# Patient Record
Sex: Male | Born: 1935 | Race: White | Hispanic: No | Marital: Married | Smoking: Former smoker
Health system: Southern US, Community
[De-identification: ages and names within clinical notes are randomized; demographics above are authoritative.]

## PROBLEM LIST (undated history)

## (undated) DIAGNOSIS — F329 Major depressive disorder, single episode, unspecified: Secondary | ICD-10-CM

## (undated) DIAGNOSIS — M4800 Spinal stenosis, site unspecified: Secondary | ICD-10-CM

## (undated) DIAGNOSIS — F419 Anxiety disorder, unspecified: Secondary | ICD-10-CM

## (undated) DIAGNOSIS — F32A Depression, unspecified: Secondary | ICD-10-CM

## (undated) DIAGNOSIS — R1310 Dysphagia, unspecified: Secondary | ICD-10-CM

## (undated) DIAGNOSIS — K224 Dyskinesia of esophagus: Secondary | ICD-10-CM

## (undated) DIAGNOSIS — J449 Chronic obstructive pulmonary disease, unspecified: Secondary | ICD-10-CM

---

## 2005-01-04 ENCOUNTER — Ambulatory Visit: Payer: Self-pay

## 2005-01-17 ENCOUNTER — Inpatient Hospital Stay: Payer: Self-pay

## 2005-03-28 ENCOUNTER — Ambulatory Visit: Payer: Self-pay

## 2005-04-15 ENCOUNTER — Ambulatory Visit: Payer: Self-pay

## 2005-04-26 ENCOUNTER — Ambulatory Visit: Payer: Self-pay

## 2005-05-26 ENCOUNTER — Ambulatory Visit: Payer: Self-pay | Admitting: Oncology

## 2005-06-09 ENCOUNTER — Ambulatory Visit: Payer: Self-pay | Admitting: Oncology

## 2005-07-08 ENCOUNTER — Ambulatory Visit: Payer: Self-pay | Admitting: Oncology

## 2005-10-07 ENCOUNTER — Ambulatory Visit: Payer: Self-pay | Admitting: Oncology

## 2005-10-13 ENCOUNTER — Ambulatory Visit: Payer: Self-pay | Admitting: Oncology

## 2006-07-13 ENCOUNTER — Ambulatory Visit: Payer: Self-pay | Admitting: Oncology

## 2006-07-17 ENCOUNTER — Ambulatory Visit: Payer: Self-pay | Admitting: Oncology

## 2006-08-12 IMAGING — CT CT CHEST W/ CM
1 series · 15 of 31 positions shown, 19 images · non-contrast
Comparison: none

REASON FOR EXAM: follow up infiltrates seen on ct of 01-04-05- with called
report
COMMENTS:

[Series 2: soft tissue · axial · 0.71mm/px · z∈[-381,-41]mm · 15 of 74 slices shown, 19 images]
[im 3/74  mediastinal]
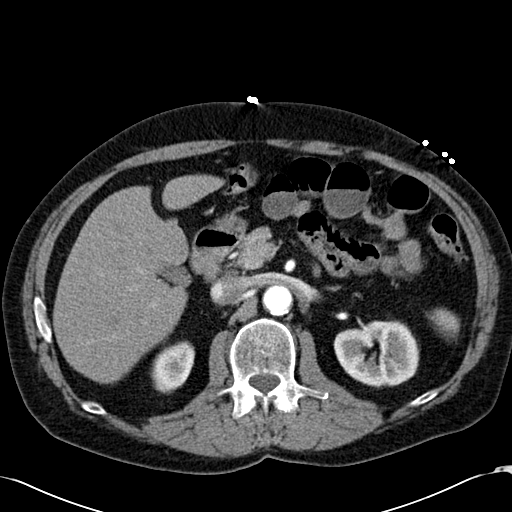
[im 3/74  lung]
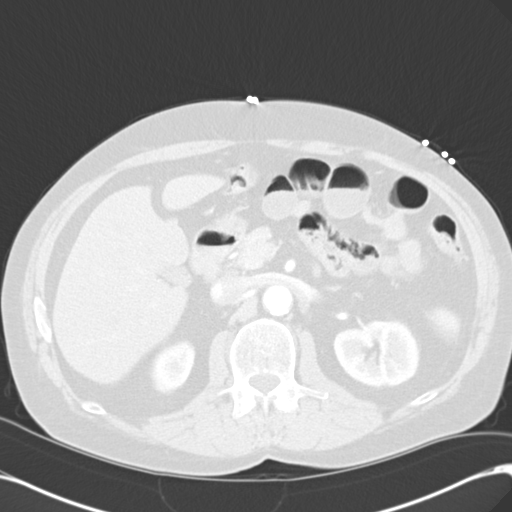
[im 9/74  lung]
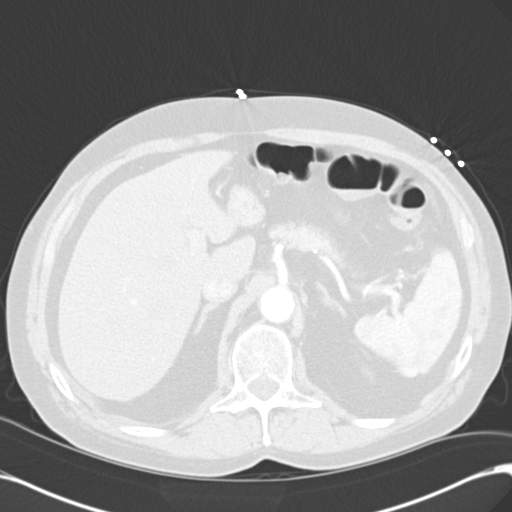
[im 14/74  lung]
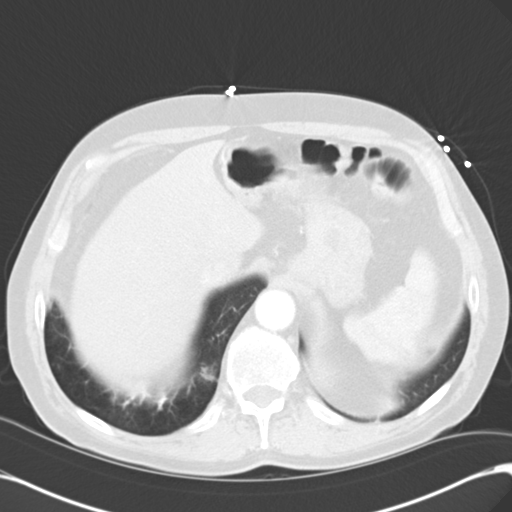
[im 17/74  lung]
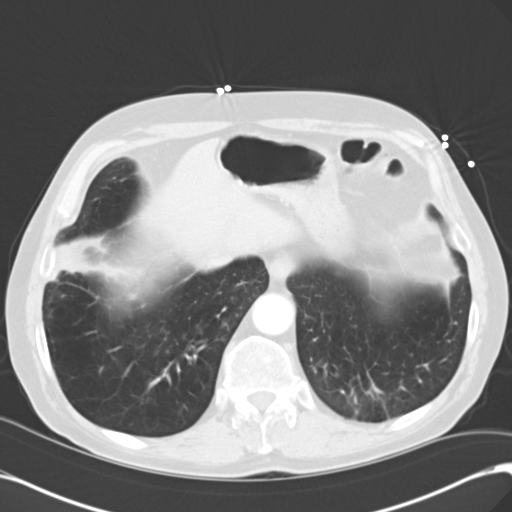
[im 22/74  mediastinal]
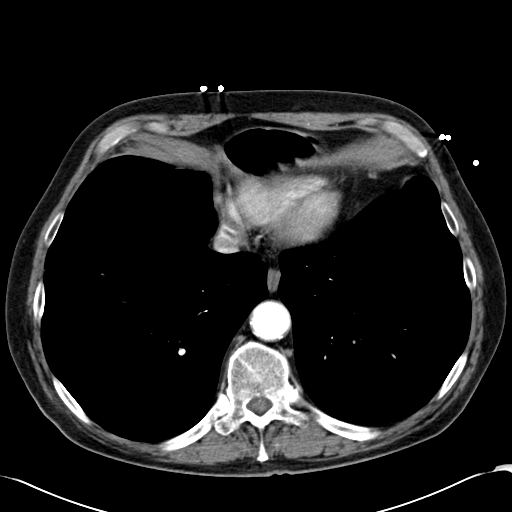
[im 22/74  lung]
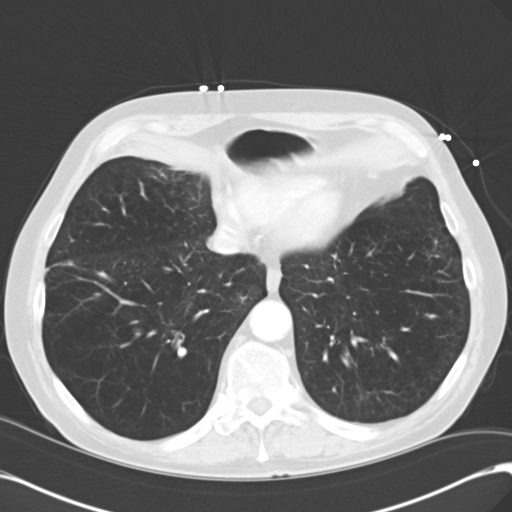
[im 28/74  lung]
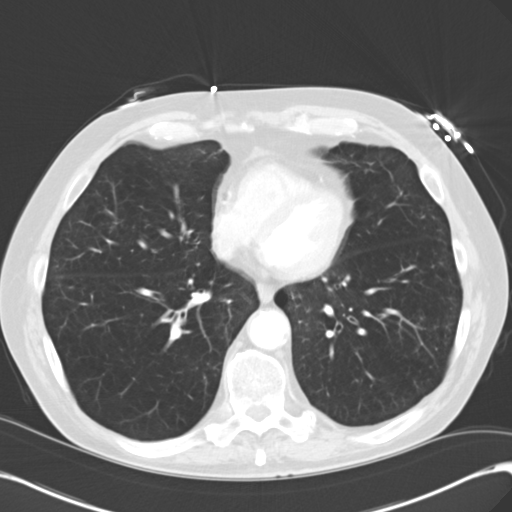
[im 33/74  lung]
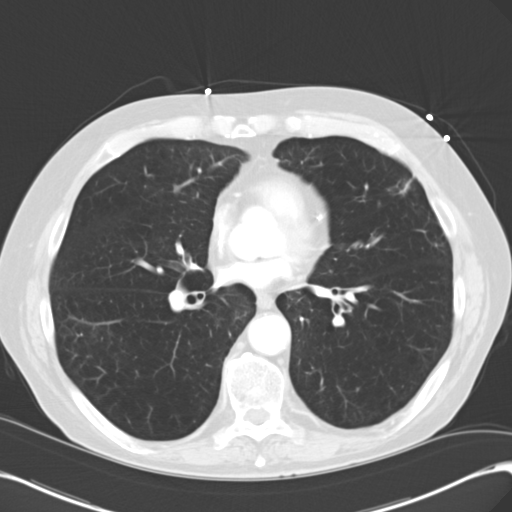
[im 38/74  lung]
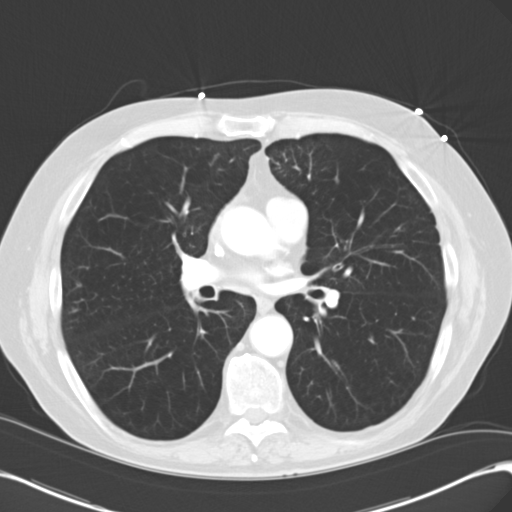
[im 41/74  mediastinal]
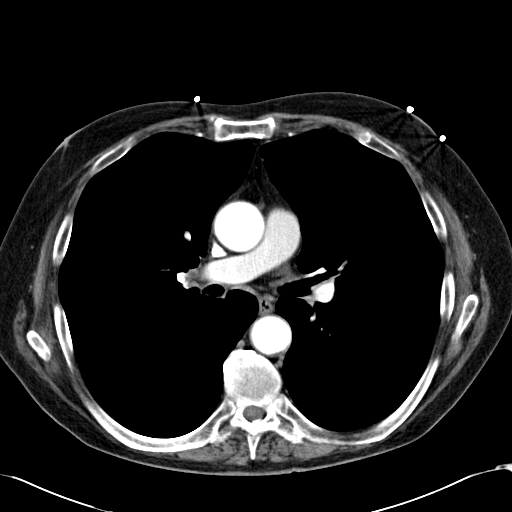
[im 41/74  lung]
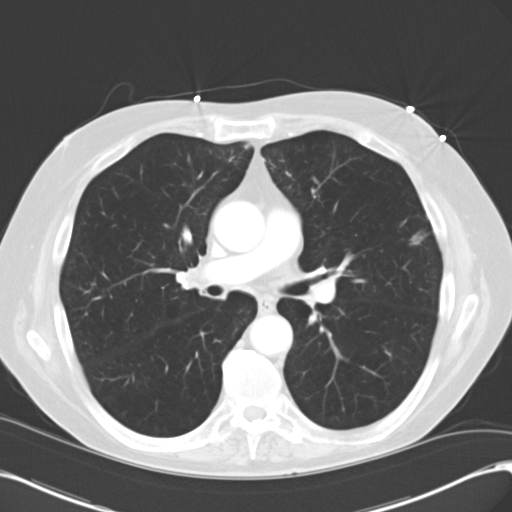
[im 46/74  lung]
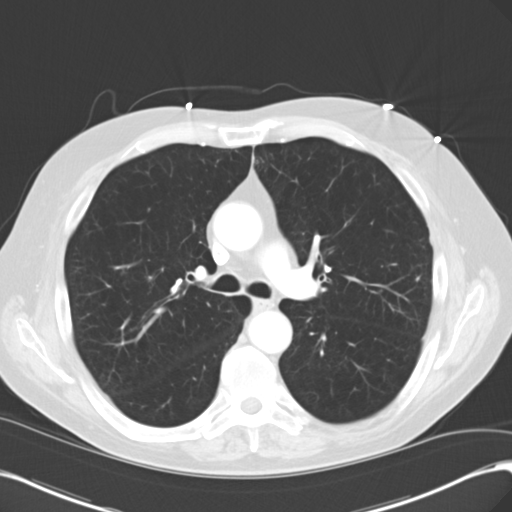
[im 52/74  lung]
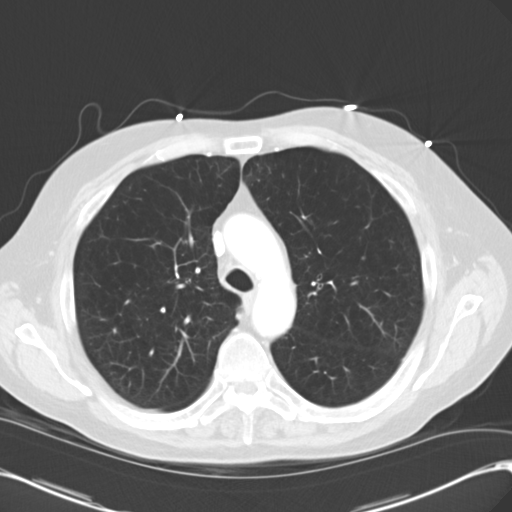
[im 57/74  lung]
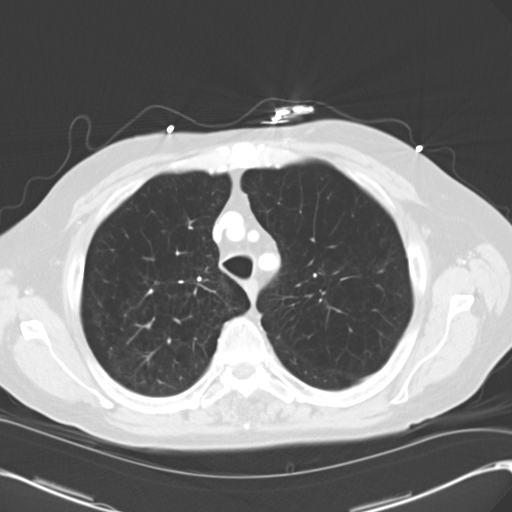
[im 60/74  mediastinal]
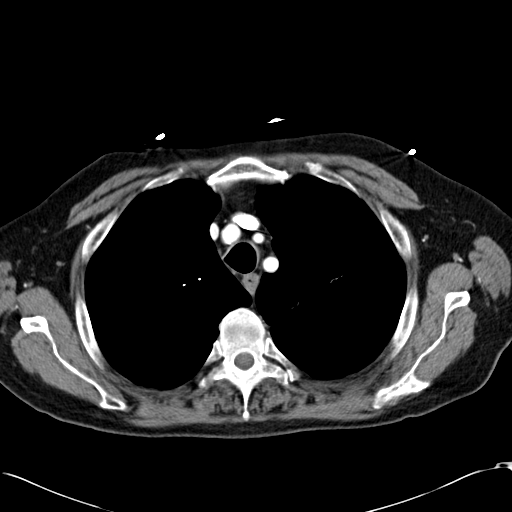
[im 60/74  lung]
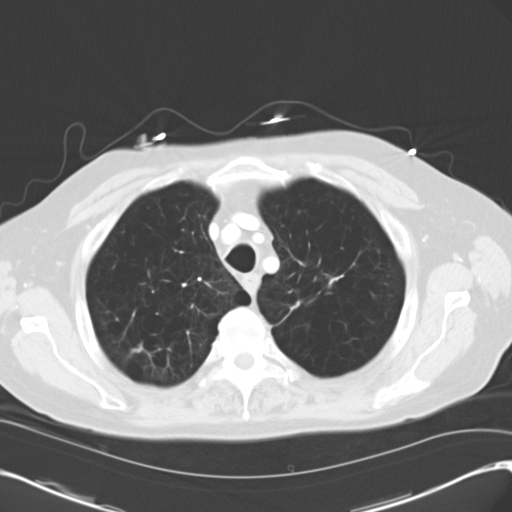
[im 65/74  lung]
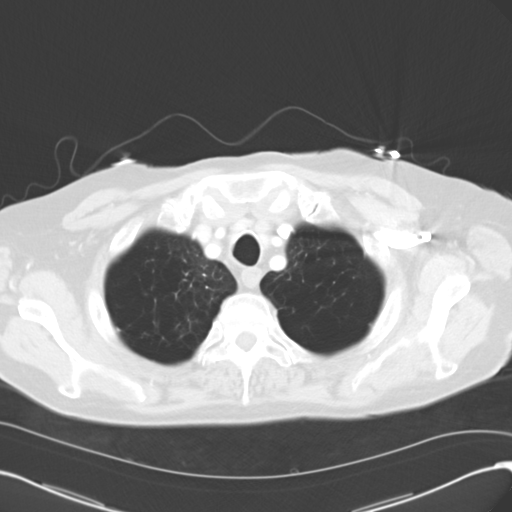
[im 71/74  lung]
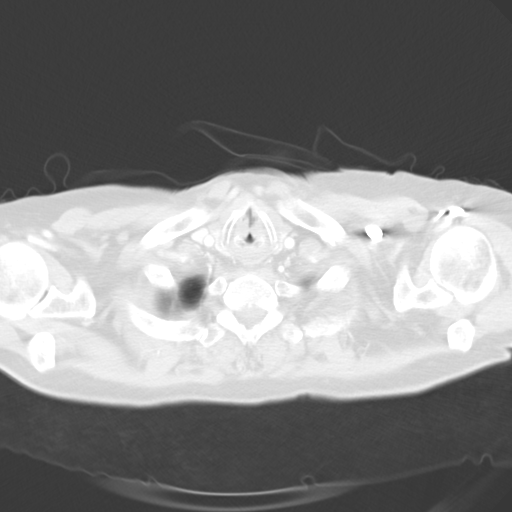

[15 of 31 positions shown; findings below may reference images not displayed]

PROCEDURE:     CT  - CT CHEST WITH CONTRAST  - January 17, 2005  [DATE]

RESULT:     Comparison is made to prior study dated 01-04-05.

Spiral 5 mm sections were obtained from the thoracic inlet through the lung
bases status post intravenous administration of 75 ccs of Isovue 370.

Evaluation of the mediastinum and hilar regions and structures demonstrates
no evidence of mediastinal or hilar adenopathy nor masses.

Evaluation of the lung parenchyma once again demonstrates a wedge shaped
area of increased density within the lateral aspect of the RIGHT lung base
along the anterior portion of the lateral basal segment of the RIGHT lower
lobe.  When compared to the previous study dated 01-04-05 this area is
unchanged.  No new pulmonary nodules or masses are identified. There are
diffuse emphysematous changes within the lungs with greatest confluence in
the lung bases.

The visualized upper abdominal viscera demonstrate no radiographic
abnormalities.
IMPRESSION: 1)Stable area of increased wedge shaped density within the RIGHT lung base
differential considerations are scar versus atelectasis.  A chronic
infiltrate cannot be excluded. There is a soft tissue component appreciated
with this area and a small mass or nodule within this area cannot be totally
excluded.  Surveillance evaluation of this region as clinically indicated is
recommended.  No further masses or nodules are identified.

2)Emphysematous changes throughout both lungs as described above.

## 2006-10-21 IMAGING — CT CT CHEST W/ CM
1 series · 15 of 31 positions shown, 19 images · non-contrast
Comparison: none

REASON FOR EXAM: Follow up 2 mo infiltrate on Chest CT
COMMENTS:

[Series 2: soft tissue · axial · 0.72mm/px · z∈[-515,-150]mm · 15 of 79 slices shown, 19 images]
[im 3/79  mediastinal]
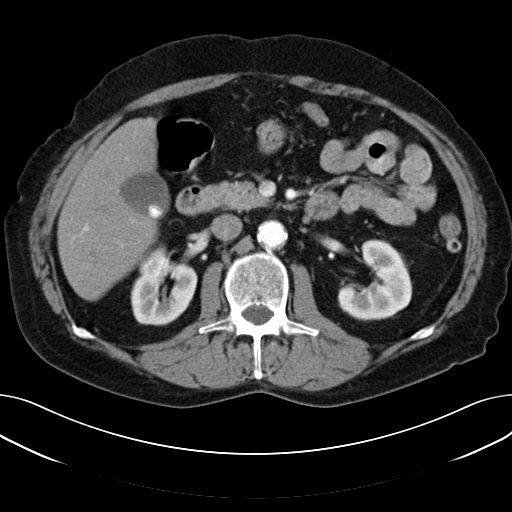
[im 3/79  lung]
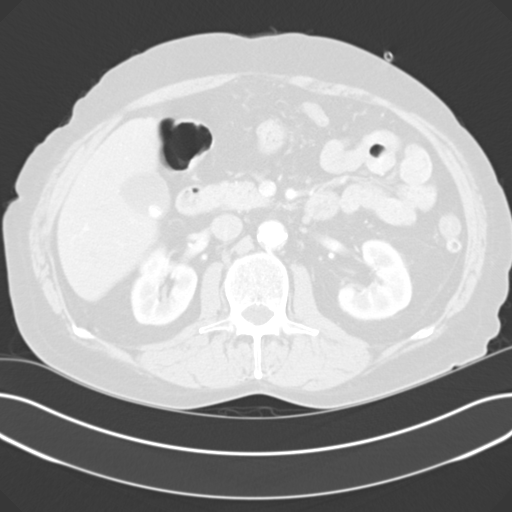
[im 9/79  lung]
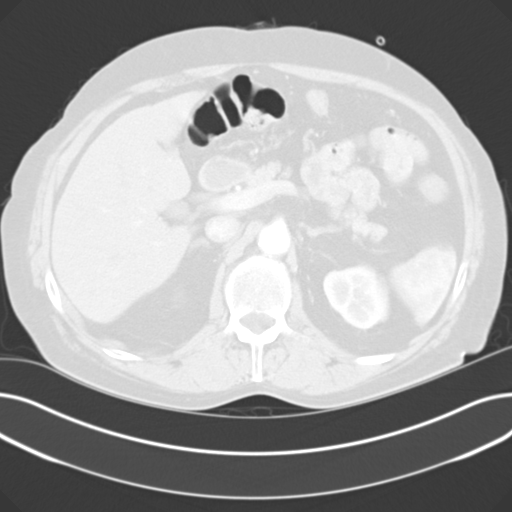
[im 15/79  lung]
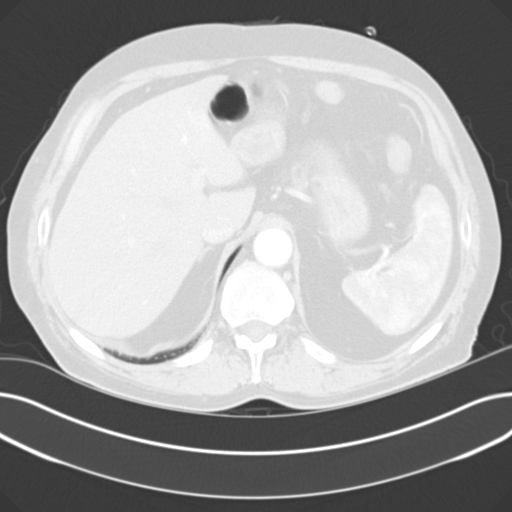
[im 18/79  lung]
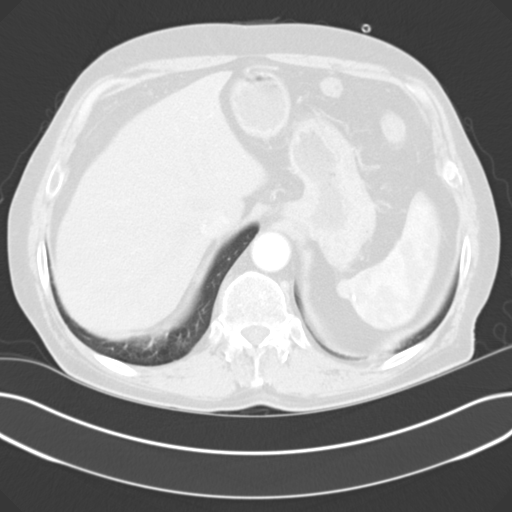
[im 24/79  mediastinal]
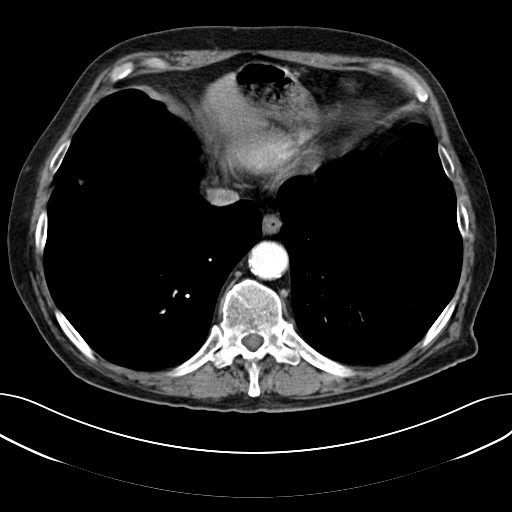
[im 24/79  lung]
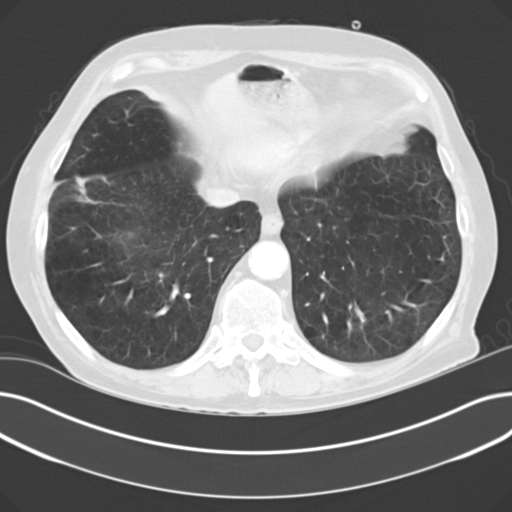
[im 29/79  lung]
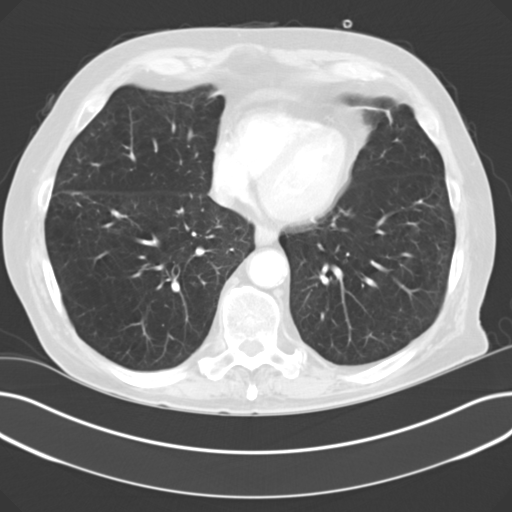
[im 35/79  lung]
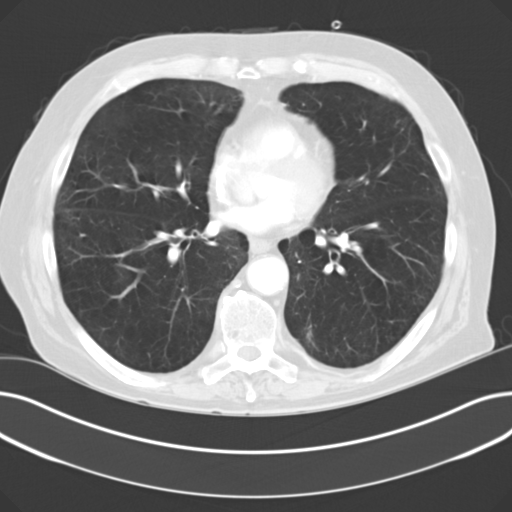
[im 41/79  lung]
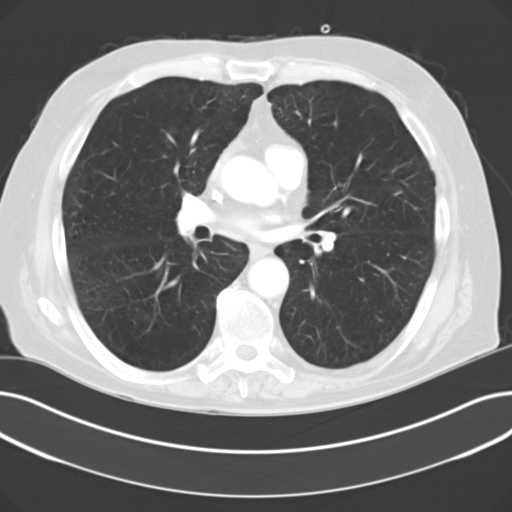
[im 44/79  mediastinal]
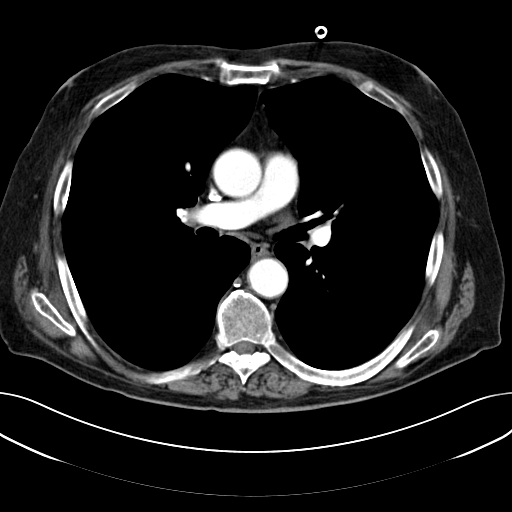
[im 44/79  lung]
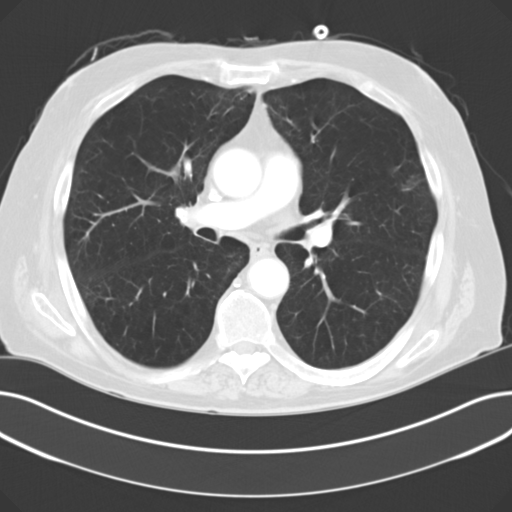
[im 50/79  lung]
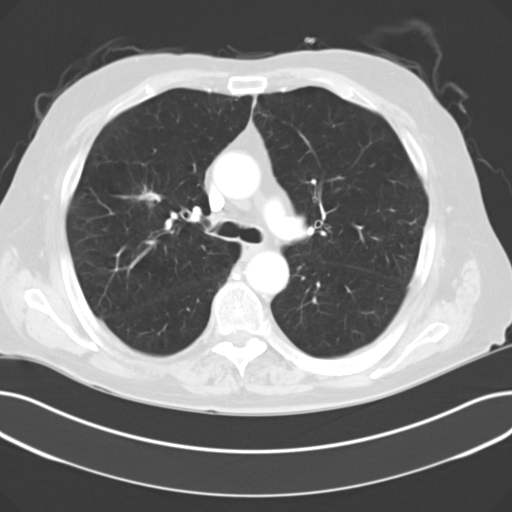
[im 55/79  lung]
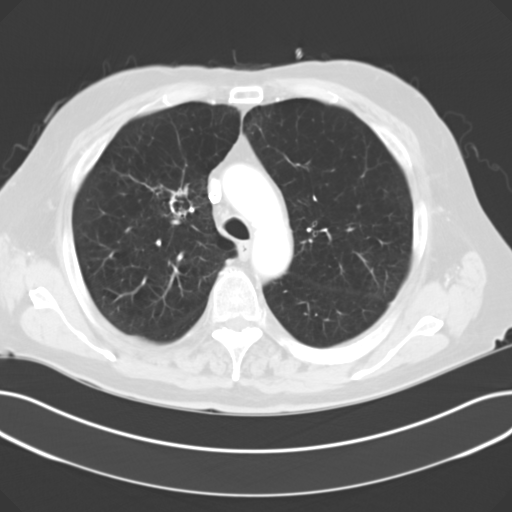
[im 61/79  lung]
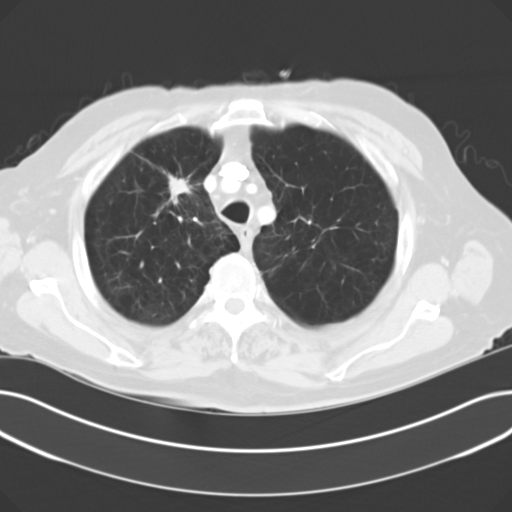
[im 64/79  mediastinal]
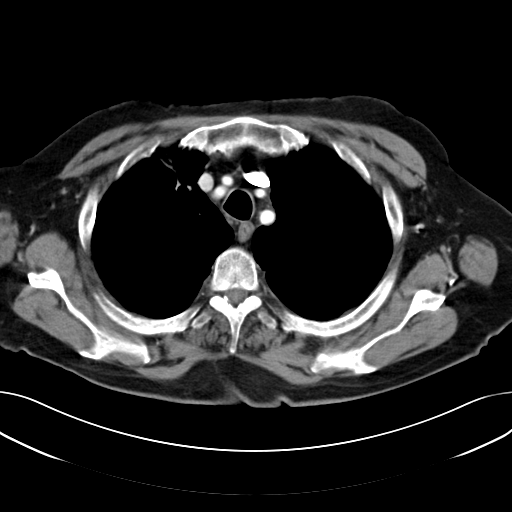
[im 64/79  lung]
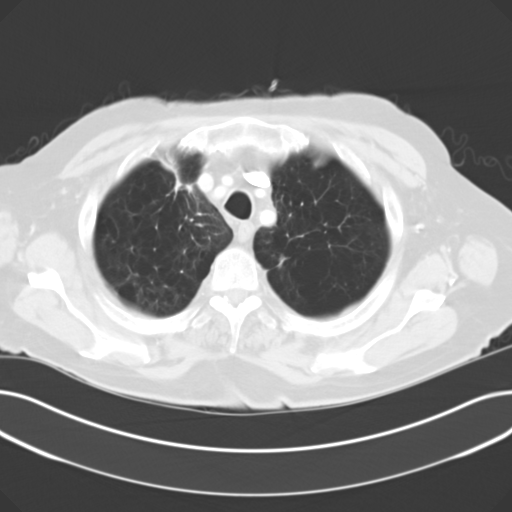
[im 70/79  lung]
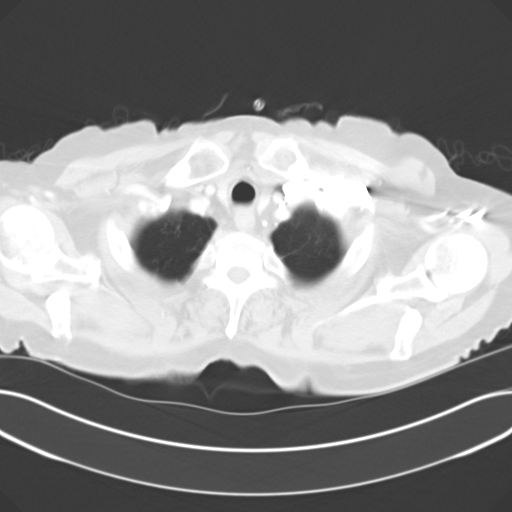
[im 76/79  lung]
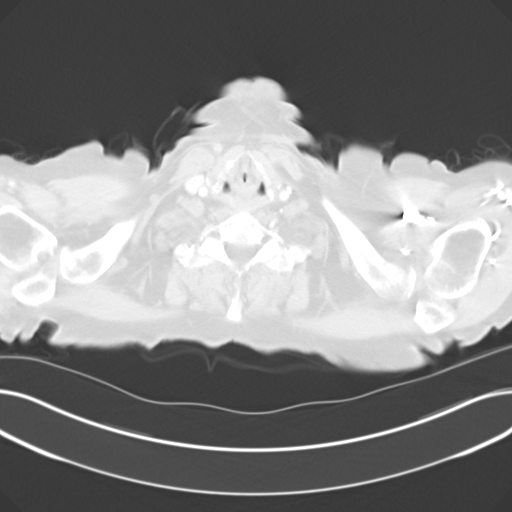

[15 of 31 positions shown; findings below may reference images not displayed]

PROCEDURE:     CT  - CT CHEST WITH CONTRAST  - March 28, 2005  [DATE]

RESULT:     Comparison is made to the prior study performed on 01-17-05.
There is no evidence of a mediastinal or hilar mass or adenopathy. The heart
is not enlarged. Gallstones are noted in the dependent portion of the
gallbladder.  This area was not included on the prior study.  Diffuse
emphysematous changes are present. There is an area of nodular density
involving the RIGHT upper lobe with irregular margins.  This is at the level
of the origin of the great vessels.  A second area of density is seen in the
RIGHT upper lobe at the level of the carina.  The lung windows show
persistence of areas of ill-defined increased density at the lung bases
especially laterally in the RIGHT costophrenic angle region suggestive of
fibrosis.  The areas in the RIGHT upper lobe were not present on the prior
exam. The larger of these measures up to approximately 1.5 cm in size.
There may be some minimal density posteriorly at the same level on image #17
and follow-up on subsequent studies would be recommended. The upper
abdominal viscera included on the study otherwise appear to be normal aside
from the previously mentioned gallstones.
IMPRESSION: 1)Areas of soft tissue density and irregular margins in the RIGHT upper lobe
as described.  These were not present on the prior study.  Differential
considerations would include an acute infectious etiology given the rather
dramatic change over the short interval compared to the prior study as well
as neoplastic disease.  There is no evidence of reactive mediastinal or
hilar adenopathy or metastatic involvement within the mediastinal or hilar
structures.  Correlation for signs or symptoms of an acute infectious
process would be recommended.

2)Underlying COPD is noted.  Follow-up with PET scan may be necessary for
further evaluation.

## 2007-07-09 ENCOUNTER — Ambulatory Visit: Payer: Self-pay | Admitting: Oncology

## 2007-07-23 ENCOUNTER — Ambulatory Visit: Payer: Self-pay | Admitting: Oncology

## 2007-08-08 ENCOUNTER — Ambulatory Visit: Payer: Self-pay | Admitting: Oncology

## 2007-11-01 ENCOUNTER — Other Ambulatory Visit: Payer: Self-pay

## 2007-11-01 ENCOUNTER — Inpatient Hospital Stay: Payer: Self-pay | Admitting: Specialist

## 2010-10-19 ENCOUNTER — Ambulatory Visit: Payer: Self-pay | Admitting: Orthopedic Surgery

## 2010-11-30 ENCOUNTER — Ambulatory Visit: Payer: Self-pay | Admitting: Pain Medicine

## 2010-12-13 ENCOUNTER — Ambulatory Visit: Payer: Self-pay | Admitting: Pain Medicine

## 2010-12-21 ENCOUNTER — Ambulatory Visit: Payer: Self-pay | Admitting: Pain Medicine

## 2011-01-03 ENCOUNTER — Ambulatory Visit: Payer: Self-pay | Admitting: Pain Medicine

## 2014-04-20 ENCOUNTER — Emergency Department: Payer: Self-pay | Admitting: Internal Medicine

## 2014-04-20 LAB — COMPREHENSIVE METABOLIC PANEL
ALT: 15 U/L (ref 12–78)
AST: 13 U/L — AB (ref 15–37)
Albumin: 3.6 g/dL (ref 3.4–5.0)
Alkaline Phosphatase: 71 U/L
Anion Gap: 3 — ABNORMAL LOW (ref 7–16)
BUN: 16 mg/dL (ref 7–18)
Bilirubin,Total: 0.5 mg/dL (ref 0.2–1.0)
Calcium, Total: 9.3 mg/dL (ref 8.5–10.1)
Chloride: 101 mmol/L (ref 98–107)
Co2: 32 mmol/L (ref 21–32)
Creatinine: 0.78 mg/dL (ref 0.60–1.30)
EGFR (Non-African Amer.): >60
Glucose: 119 mg/dL — ABNORMAL HIGH (ref 65–99)
OSMOLALITY: 274 (ref 275–301)
POTASSIUM: 4.1 mmol/L (ref 3.5–5.1)
Sodium: 136 mmol/L (ref 136–145)
Total Protein: 6.7 g/dL (ref 6.4–8.2)

## 2014-04-20 LAB — CBC
HCT: 44.8 % (ref 40.0–52.0)
HGB: 14.9 g/dL (ref 13.0–18.0)
MCH: 28.4 pg (ref 26.0–34.0)
MCHC: 33.2 g/dL (ref 32.0–36.0)
MCV: 86 fL (ref 80–100)
Platelet: 279 10*3/uL (ref 150–440)
RBC: 5.24 10*6/uL (ref 4.40–5.90)
RDW: 13.4 % (ref 11.5–14.5)
WBC: 8 10*3/uL (ref 3.8–10.6)

## 2014-04-20 LAB — PRO B NATRIURETIC PEPTIDE: B-Type Natriuretic Peptide: 162 pg/mL (ref 0–450)

## 2014-04-20 LAB — TROPONIN I

## 2015-07-17 ENCOUNTER — Other Ambulatory Visit: Payer: Self-pay | Admitting: Specialist

## 2015-07-17 DIAGNOSIS — J984 Other disorders of lung: Secondary | ICD-10-CM

## 2015-07-23 ENCOUNTER — Ambulatory Visit
Admission: RE | Admit: 2015-07-23 | Discharge: 2015-07-23 | Disposition: A | Discharge status: Home / Self Care | Payer: Medicare Other | Source: Ambulatory Visit | Attending: Specialist | Admitting: Specialist

## 2015-07-23 DIAGNOSIS — J439 Emphysema, unspecified: Secondary | ICD-10-CM | POA: Diagnosis not present

## 2015-07-23 DIAGNOSIS — J984 Other disorders of lung: Secondary | ICD-10-CM | POA: Diagnosis present

## 2016-02-11 ENCOUNTER — Other Ambulatory Visit: Payer: Self-pay | Admitting: Physician Assistant

## 2016-02-11 DIAGNOSIS — R1310 Dysphagia, unspecified: Secondary | ICD-10-CM

## 2016-02-12 ENCOUNTER — Ambulatory Visit
Admission: RE | Admit: 2016-02-12 | Discharge: 2016-02-12 | Disposition: A | Discharge status: Home / Self Care | Payer: Medicare Other | Source: Ambulatory Visit | Attending: Physician Assistant | Admitting: Physician Assistant

## 2016-02-12 DIAGNOSIS — R1319 Other dysphagia: Secondary | ICD-10-CM | POA: Diagnosis not present

## 2016-02-12 DIAGNOSIS — R1310 Dysphagia, unspecified: Secondary | ICD-10-CM

## 2016-04-27 ENCOUNTER — Emergency Department
Admission: EM | Admit: 2016-04-27 | Discharge: 2016-04-27 | Disposition: A | Discharge status: Home / Self Care | Payer: Medicare Other | Attending: Emergency Medicine | Admitting: Emergency Medicine

## 2016-04-27 ENCOUNTER — Encounter: Payer: Self-pay | Admitting: Emergency Medicine

## 2016-04-27 DIAGNOSIS — Y999 Unspecified external cause status: Secondary | ICD-10-CM | POA: Insufficient documentation

## 2016-04-27 DIAGNOSIS — Z79899 Other long term (current) drug therapy: Secondary | ICD-10-CM | POA: Insufficient documentation

## 2016-04-27 DIAGNOSIS — Z7982 Long term (current) use of aspirin: Secondary | ICD-10-CM | POA: Insufficient documentation

## 2016-04-27 DIAGNOSIS — Y929 Unspecified place or not applicable: Secondary | ICD-10-CM | POA: Diagnosis not present

## 2016-04-27 DIAGNOSIS — Z87891 Personal history of nicotine dependence: Secondary | ICD-10-CM | POA: Diagnosis not present

## 2016-04-27 DIAGNOSIS — Y9389 Activity, other specified: Secondary | ICD-10-CM | POA: Insufficient documentation

## 2016-04-27 DIAGNOSIS — X58XXXA Exposure to other specified factors, initial encounter: Secondary | ICD-10-CM | POA: Insufficient documentation

## 2016-04-27 DIAGNOSIS — T18128A Food in esophagus causing other injury, initial encounter: Secondary | ICD-10-CM | POA: Insufficient documentation

## 2016-04-27 DIAGNOSIS — R1310 Dysphagia, unspecified: Secondary | ICD-10-CM | POA: Diagnosis present

## 2016-04-27 DIAGNOSIS — K222 Esophageal obstruction: Secondary | ICD-10-CM

## 2016-04-27 MED ORDER — NITROGLYCERIN 0.4 MG SL SUBL
0.4000 mg | SUBLINGUAL_TABLET | Freq: Once | SUBLINGUAL | Status: AC
  Administered 2016-04-27: 0.4 mg via SUBLINGUAL

## 2016-04-27 MED ORDER — NITROGLYCERIN 0.4 MG SL SUBL
SUBLINGUAL_TABLET | SUBLINGUAL | Status: AC
  Administered 2016-04-27: 0.4 mg via SUBLINGUAL
  Filled 2016-04-27: qty 1

## 2016-04-27 NOTE — Discharge Instructions (Signed)
Please call the number provided for GI medicine to arrange a palpable as soon as possible for further evaluation and possible endoscopy. Return to the emergency department for any further difficulty swallowing.   Esophagogastroduodenoscopy Esophagogastroduodenoscopy (EGD) is a procedure that is used to examine the lining of the esophagus, stomach, and first part of the small intestine (duodenum). A long, flexible, lighted tube with a camera attached (endoscope) is inserted down the throat to view these organs. This procedure is done to detect problems or abnormalities, such as inflammation, bleeding, ulcers, or growths, in order to treat them. The procedure lasts 5-20 minutes. It is usually an outpatient procedure, but it may need to be performed in a hospital in emergency cases. LET [facility] PROVID[facility] KNOW ABOUT:  Any allergies you have.  All medicines you are taking, including vitamins, herbs, eye drops, creams, and over-the-counter medicines.  Previous problems you or members of your family have had with the use of anesthetics.  Any blood disorders you have.  Previous surgeries you have had.  Medical conditions you have. RISKS AND COMPLICATIONS Generally, this is a safe procedure. However, problems can occur and include:  Infection.  Bleeding.  Tearing (perforation) of the esophagus, stomach, or duodenum.  Difficulty breathing or not being able to breathe.  Excessive sweating.  Spasms of the larynx.  Slowed heartbeat.  Low blood pressure. BEFORE THE PROCEDURE  Do not eat or drink anything after midnight on the night before the procedure or as directed by your health care provider.  Do not take your regular medicines before the procedure if your health care provider asks you not to. Ask your health care provider about changing or stopping those medicines.  If you wear dentures, be prepared to remove them before the procedure.  Arrange for someone to drive you home  after the procedure. PROCEDURE  A numbing medicine (local anesthetic) may be sprayed in your throat for comfort and to stop you from gagging or coughing.  You will have an IV tube inserted in a vein in your hand or arm. You will receive medicines and fluids through this tube.  You will be given a medicine to relax you (sedative).  A pain reliever will be given through the IV tube.  A mouth guard may be placed in your mouth to protect your teeth and to keep you from biting on the endoscope.  You will be asked to lie on your left side.  The endoscope will be inserted down your throat and into your esophagus, stomach, and duodenum.  Air will be put through the endoscope to allow your health care provider to clearly view the lining of your esophagus.  The lining of your esophagus, stomach, and duodenum will be examined. During the exam, your health care provider may:  Remove tissue to be examined under a microscope (biopsy) for inflammation, infection, or other medical problems.  Remove growths.  Remove objects (foreign bodies) that are stuck.  Treat any bleeding with medicines or other devices that stop tissues from bleeding (hot cautery, clipping devices).  Widen (dilate) or stretch narrowed areas of your esophagus and stomach.  The endoscope will be withdrawn. AFTER THE PROCEDURE  You will be taken to a recovery area for observation. Your blood pressure, heart rate, breathing rate, and blood oxygen level will be monitored often until the medicines you were given have worn off.  Do not eat or drink anything until the numbing medicine has worn off and your gag reflex has returned.  You may choke.  Your health care provider should be able to discuss his or her findings with you. It will take longer to discuss the test results if any biopsies were taken.   This information is not intended to replace advice given to you by your health care provider. Make sure you discuss any  questions you have with your health care provider.   Document Released: 02/24/2005 Document Revised: 11/14/2014 Document Reviewed: 09/26/2012 Elsevier Interactive Patient Education [employer]

## 2016-04-27 NOTE — ED Notes (Signed)
COMMUNICATED TO MD PT'S INTOLERANCE OF PO FLUIDS. ORDERS RECEIVED.

## 2016-04-27 NOTE — ED Notes (Signed)
Pt to ed with c/o difficulty swallowing for several months,  Pt reports he has had testing done but they found nothing wrong.  Pt reports last night he was eating and got choked and since has not been able to get fluids or solids to stay down.

## 2016-04-27 NOTE — ED Provider Notes (Signed)
[facility] Emergency Department Provider Note  Time seen: 9:53 AM  I have reviewed the triage vital signs and the nursing notes.   HISTORY  Chief Complaint Dysphagia    HPI Gerald EhlersRobert L Ortiz is a 82 y.o. male with a history of difficulty swallowing who presents the emergency department unable to swallow. According to the patient for the past several months she's been having some difficulty swallowing where he states he occasionally will get choked on food. He states he ate a bacon, lettuce, tomato sandwich last night for dinner however since he feels like something is stuck in his esophagus and when ever he attempts to drink or eat he regurgitates food or liquid back up. Since this is the first time he has ever not been able to swallow. He states he is having some difficulty swallowing secretions as well. Denies any trouble breathing. Denies any chest pain or abdominal pain. Denies any nausea or vomiting.The patient states he's had a barium swallow study performed in the past with normal results, has not had an endoscopy for 20+ years.     History reviewed. No pertinent past medical history.  There are no active problems to display for this patient.   History reviewed. No pertinent past surgical history.  No current outpatient prescriptions on file.  Allergies Review of patient's allergies indicates no known allergies.  History reviewed. No pertinent family history.  Social History Social History  Substance Use Topics  . Smoking status: Former Games developermoker  . Smokeless tobacco: None  . Alcohol Use: Yes    Review of Systems Constitutional: Negative for fever. Cardiovascular: Negative for chest pain. Respiratory: Negative for shortness of breath. Gastrointestinal: Negative for abdominal pain Musculoskeletal: Negative for back pain. Neurological: Negative for headache 10-point ROS otherwise  negative.  ____________________________________________   PHYSICAL EXAM:  VITAL SIGNS: ED Triage Vitals  Enc Vitals Group     BP 04/27/16 0925 112/62 mmHg     Pulse Rate 04/27/16 0925 76     Resp 04/27/16 0925 20     Temp 04/27/16 0925 98.3 F (36.8 C)     Temp Source 04/27/16 0925 Oral     SpO2 04/27/16 0925 99 %     Weight 04/27/16 0925 137 lb (62.143 kg)     Height 04/27/16 0925 5\' 8"  (1.727 m)     Head Cir --      Peak Flow --      Pain Score 04/27/16 0926 0     Pain Loc --      Pain Edu? --      Excl. in GC? --     Constitutional: Alert and oriented. Well appearing and in no distress. Eyes: Normal exam ENT   Head: Normocephalic and atraumatic.   Mouth/Throat: Mucous membranes are moist. Cardiovascular: Normal rate, regular rhythm. No murmur Respiratory: Normal respiratory effort without tachypnea nor retractions. Breath sounds are clear  Gastrointestinal: Soft and nontender. No distention.  Musculoskeletal: Nontender with normal range of motion in all extremities Neurologic:  Normal speech and language. No gross focal neurologic deficits  Skin:  Skin is warm, dry and intact.  Psychiatric: Mood and affect are normal.   ____________________________________________   INITIAL IMPRESSION / ASSESSMENT AND PLAN / ED COURSE  Pertinent labs & imaging results that were available during my care of the patient were reviewed by me and considered in my medical decision making (see chart for details).  Well-appearing patient, no distress. Sitting upright in bed saying that he  does not feel he can swallow. We will start by encouraging carbonated beverages to see if the patient is able to get any liquids down. Patient states he has not been wearing dentures recently so he has not been chewing his food very well before swallowing.  Patient attempted swallowing soda, was unable to do so. Liquid was immediately regurgitated. We'll attempt esophageal relaxation with 1  sublingual nitroglycerin tablet.  Patient is able to swallow crackers and liquids without difficulty now. Feels like the obstruction has resolved. We will discharge the patient home with GI follow-up. Patient agreeable to plan.  ____________________________________________   FINAL CLINICAL IMPRESSION(S) / ED DIAGNOSES  Esophageal obstruction   [name], MD 04/27/16 1213

## 2016-05-25 ENCOUNTER — Other Ambulatory Visit: Payer: Self-pay | Admitting: Nurse Practitioner

## 2016-05-25 DIAGNOSIS — R1310 Dysphagia, unspecified: Secondary | ICD-10-CM

## 2017-04-30 ENCOUNTER — Observation Stay
Admission: EM | Admit: 2017-04-30 | Discharge: 2017-05-04 | Disposition: A | Discharge status: Home Health | Payer: Medicare Other | Attending: Internal Medicine | Admitting: Internal Medicine

## 2017-04-30 ENCOUNTER — Encounter: Payer: Self-pay | Admitting: Emergency Medicine

## 2017-04-30 ENCOUNTER — Emergency Department: Payer: Medicare Other

## 2017-04-30 DIAGNOSIS — M6281 Muscle weakness (generalized): Secondary | ICD-10-CM | POA: Diagnosis not present

## 2017-04-30 DIAGNOSIS — Z79891 Long term (current) use of opiate analgesic: Secondary | ICD-10-CM | POA: Diagnosis not present

## 2017-04-30 DIAGNOSIS — R531 Weakness: Secondary | ICD-10-CM

## 2017-04-30 DIAGNOSIS — Z79899 Other long term (current) drug therapy: Secondary | ICD-10-CM | POA: Diagnosis not present

## 2017-04-30 DIAGNOSIS — T40605A Adverse effect of unspecified narcotics, initial encounter: Secondary | ICD-10-CM | POA: Insufficient documentation

## 2017-04-30 DIAGNOSIS — J441 Chronic obstructive pulmonary disease with (acute) exacerbation: Secondary | ICD-10-CM | POA: Diagnosis present

## 2017-04-30 DIAGNOSIS — M48061 Spinal stenosis, lumbar region without neurogenic claudication: Secondary | ICD-10-CM | POA: Insufficient documentation

## 2017-04-30 DIAGNOSIS — R0603 Acute respiratory distress: Secondary | ICD-10-CM

## 2017-04-30 DIAGNOSIS — E860 Dehydration: Secondary | ICD-10-CM | POA: Insufficient documentation

## 2017-04-30 DIAGNOSIS — R296 Repeated falls: Secondary | ICD-10-CM | POA: Insufficient documentation

## 2017-04-30 DIAGNOSIS — J439 Emphysema, unspecified: Secondary | ICD-10-CM | POA: Diagnosis not present

## 2017-04-30 DIAGNOSIS — F329 Major depressive disorder, single episode, unspecified: Secondary | ICD-10-CM | POA: Diagnosis not present

## 2017-04-30 DIAGNOSIS — Z7982 Long term (current) use of aspirin: Secondary | ICD-10-CM | POA: Diagnosis not present

## 2017-04-30 DIAGNOSIS — Z87891 Personal history of nicotine dependence: Secondary | ICD-10-CM | POA: Insufficient documentation

## 2017-04-30 DIAGNOSIS — R2681 Unsteadiness on feet: Secondary | ICD-10-CM | POA: Insufficient documentation

## 2017-04-30 DIAGNOSIS — R1011 Right upper quadrant pain: Secondary | ICD-10-CM

## 2017-04-30 DIAGNOSIS — Z7951 Long term (current) use of inhaled steroids: Secondary | ICD-10-CM | POA: Diagnosis not present

## 2017-04-30 DIAGNOSIS — R55 Syncope and collapse: Secondary | ICD-10-CM | POA: Insufficient documentation

## 2017-04-30 DIAGNOSIS — K5900 Constipation, unspecified: Secondary | ICD-10-CM | POA: Diagnosis not present

## 2017-04-30 DIAGNOSIS — G8929 Other chronic pain: Secondary | ICD-10-CM | POA: Insufficient documentation

## 2017-04-30 DIAGNOSIS — I951 Orthostatic hypotension: Secondary | ICD-10-CM | POA: Diagnosis not present

## 2017-04-30 DIAGNOSIS — R627 Adult failure to thrive: Secondary | ICD-10-CM | POA: Insufficient documentation

## 2017-04-30 DIAGNOSIS — F419 Anxiety disorder, unspecified: Secondary | ICD-10-CM | POA: Insufficient documentation

## 2017-04-30 DIAGNOSIS — Z9981 Dependence on supplemental oxygen: Secondary | ICD-10-CM | POA: Insufficient documentation

## 2017-04-30 DIAGNOSIS — D649 Anemia, unspecified: Secondary | ICD-10-CM | POA: Insufficient documentation

## 2017-04-30 DIAGNOSIS — J9621 Acute and chronic respiratory failure with hypoxia: Secondary | ICD-10-CM | POA: Insufficient documentation

## 2017-04-30 DIAGNOSIS — I639 Cerebral infarction, unspecified: Secondary | ICD-10-CM

## 2017-04-30 DIAGNOSIS — Z7952 Long term (current) use of systemic steroids: Secondary | ICD-10-CM | POA: Insufficient documentation

## 2017-04-30 DIAGNOSIS — R1084 Generalized abdominal pain: Secondary | ICD-10-CM

## 2017-04-30 HISTORY — DX: Anxiety disorder, unspecified: F41.9

## 2017-04-30 HISTORY — DX: Major depressive disorder, single episode, unspecified: F32.9

## 2017-04-30 HISTORY — DX: Dysphagia, unspecified: R13.10

## 2017-04-30 HISTORY — DX: Dyskinesia of esophagus: K22.4

## 2017-04-30 HISTORY — DX: Spinal stenosis, site unspecified: M48.00

## 2017-04-30 HISTORY — DX: Chronic obstructive pulmonary disease, unspecified: J44.9

## 2017-04-30 HISTORY — DX: Depression, unspecified: F32.A

## 2017-04-30 LAB — BASIC METABOLIC PANEL
ANION GAP: 4 — AB (ref 5–15)
BUN: 14 mg/dL (ref 6–20)
CHLORIDE: 95 mmol/L — AB (ref 101–111)
CO2: 42 mmol/L — ABNORMAL HIGH (ref 22–32)
Calcium: 9.2 mg/dL (ref 8.9–10.3)
Creatinine, Ser: 0.63 mg/dL (ref 0.61–1.24)
GFR calc Af Amer: >60 mL/min (ref >60)
Glucose, Bld: 104 mg/dL — ABNORMAL HIGH (ref 65–99)
POTASSIUM: 4.1 mmol/L (ref 3.5–5.1)
Sodium: 141 mmol/L (ref 135–145)

## 2017-04-30 LAB — TROPONIN I: Troponin I: <0.03 ng/mL (ref <0.03)

## 2017-04-30 LAB — CBC
HEMATOCRIT: 35.8 % — AB (ref 40.0–52.0)
HEMOGLOBIN: 11.8 g/dL — AB (ref 13.0–18.0)
MCH: 28.8 pg (ref 26.0–34.0)
MCHC: 32.9 g/dL (ref 32.0–36.0)
MCV: 87.4 fL (ref 80.0–100.0)
Platelets: 248 10*3/uL (ref 150–440)
RBC: 4.1 MIL/uL — ABNORMAL LOW (ref 4.40–5.90)
RDW: 13.5 % (ref 11.5–14.5)
WBC: 10.6 10*3/uL (ref 3.8–10.6)

## 2017-04-30 MED ORDER — PANTOPRAZOLE SODIUM 40 MG PO TBEC
40.0000 mg | DELAYED_RELEASE_TABLET | Freq: Every day | ORAL | Status: DC
  Administered 2017-04-30 – 2017-05-04 (×5): 40 mg via ORAL
  Filled 2017-04-30 (×5): qty 1

## 2017-04-30 MED ORDER — THEOPHYLLINE ER 300 MG PO CP24
300.0000 mg | ORAL_CAPSULE | Freq: Every day | ORAL | Status: DC
  Administered 2017-04-30 – 2017-05-03 (×4): 300 mg via ORAL
  Filled 2017-04-30 (×5): qty 1

## 2017-04-30 MED ORDER — ALBUTEROL SULFATE (2.5 MG/3ML) 0.083% IN NEBU
3.0000 mL | INHALATION_SOLUTION | Freq: Four times a day (QID) | RESPIRATORY_TRACT | Status: DC | PRN
  Administered 2017-05-01: 3 mL via RESPIRATORY_TRACT
  Filled 2017-04-30: qty 3

## 2017-04-30 MED ORDER — IPRATROPIUM-ALBUTEROL 0.5-2.5 (3) MG/3ML IN SOLN
3.0000 mL | Freq: Once | RESPIRATORY_TRACT | Status: AC
  Administered 2017-04-30: 3 mL via RESPIRATORY_TRACT
  Filled 2017-04-30: qty 3

## 2017-04-30 MED ORDER — PREDNISONE 10 MG PO TABS
5.0000 mg | ORAL_TABLET | Freq: Every day | ORAL | Status: DC
  Administered 2017-04-30 – 2017-05-03 (×3): 5 mg via ORAL
  Filled 2017-04-30 (×4): qty 1

## 2017-04-30 MED ORDER — ACETAMINOPHEN 500 MG PO TABS: 500.0000 mg | ORAL_TABLET | Freq: Four times a day (QID) | ORAL | Status: DC | PRN

## 2017-04-30 MED ORDER — ACETAMINOPHEN 650 MG RE SUPP
650.0000 mg | Freq: Four times a day (QID) | RECTAL | Status: DC | PRN
  Filled 2017-04-30: qty 1

## 2017-04-30 MED ORDER — ENOXAPARIN SODIUM 40 MG/0.4ML ~~LOC~~ SOLN
40.0000 mg | SUBCUTANEOUS | Status: DC
  Administered 2017-04-30 – 2017-05-03 (×4): 40 mg via SUBCUTANEOUS
  Filled 2017-04-30 (×5): qty 0.4

## 2017-04-30 MED ORDER — ALPRAZOLAM 1 MG PO TABS
1.0000 mg | ORAL_TABLET | Freq: Two times a day (BID) | ORAL | Status: DC | PRN
  Administered 2017-04-30 – 2017-05-03 (×8): 1 mg via ORAL
  Filled 2017-04-30 (×8): qty 1

## 2017-04-30 MED ORDER — ONDANSETRON HCL 4 MG/2ML IJ SOLN
4.0000 mg | Freq: Four times a day (QID) | INTRAMUSCULAR | Status: DC | PRN
  Filled 2017-04-30: qty 2

## 2017-04-30 MED ORDER — THEOPHYLLINE ER 300 MG PO TB12: 300.0000 mg | ORAL_TABLET | Freq: Every day | ORAL | Status: DC

## 2017-04-30 MED ORDER — ALBUTEROL SULFATE (2.5 MG/3ML) 0.083% IN NEBU
5.0000 mg | INHALATION_SOLUTION | Freq: Once | RESPIRATORY_TRACT | Status: AC
  Administered 2017-04-30: 5 mg via RESPIRATORY_TRACT
  Filled 2017-04-30: qty 6

## 2017-04-30 MED ORDER — ADULT MULTIVITAMIN W/MINERALS CH
1.0000 | ORAL_TABLET | Freq: Every day | ORAL | Status: DC
  Administered 2017-05-01 – 2017-05-04 (×4): 1 via ORAL
  Filled 2017-04-30 (×5): qty 1

## 2017-04-30 MED ORDER — TIOTROPIUM BROMIDE MONOHYDRATE 18 MCG IN CAPS
1.0000 | ORAL_CAPSULE | Freq: Every day | RESPIRATORY_TRACT | Status: DC
  Administered 2017-04-30 – 2017-05-04 (×5): 18 ug via RESPIRATORY_TRACT
  Filled 2017-04-30: qty 5

## 2017-04-30 MED ORDER — ACETAMINOPHEN 325 MG PO TABS
650.0000 mg | ORAL_TABLET | Freq: Four times a day (QID) | ORAL | Status: DC | PRN
  Filled 2017-04-30: qty 2

## 2017-04-30 MED ORDER — BUDESONIDE 0.5 MG/2ML IN SUSP
2.0000 mL | Freq: Two times a day (BID) | RESPIRATORY_TRACT | Status: DC
  Administered 2017-04-30 – 2017-05-04 (×8): 0.5 mg via RESPIRATORY_TRACT
  Filled 2017-04-30 (×9): qty 2

## 2017-04-30 MED ORDER — ASPIRIN EC 81 MG PO TBEC: 81.0000 mg | DELAYED_RELEASE_TABLET | Freq: Every day | ORAL | Status: DC | PRN

## 2017-04-30 MED ORDER — ALBUTEROL SULFATE HFA 108 (90 BASE) MCG/ACT IN AERS: 2.0000 | INHALATION_SPRAY | Freq: Four times a day (QID) | RESPIRATORY_TRACT | Status: DC | PRN

## 2017-04-30 MED ORDER — SERTRALINE HCL 50 MG PO TABS
25.0000 mg | ORAL_TABLET | Freq: Every day | ORAL | Status: DC
  Administered 2017-04-30 – 2017-05-04 (×5): 25 mg via ORAL
  Filled 2017-04-30 (×5): qty 1

## 2017-04-30 MED ORDER — HYDROCODONE-ACETAMINOPHEN 5-325 MG PO TABS
1.0000 | ORAL_TABLET | Freq: Two times a day (BID) | ORAL | Status: DC | PRN
  Administered 2017-04-30 – 2017-05-01 (×2): 1 via ORAL
  Filled 2017-04-30 (×2): qty 1

## 2017-04-30 MED ORDER — DOXYCYCLINE HYCLATE 100 MG PO TABS
100.0000 mg | ORAL_TABLET | Freq: Once | ORAL | Status: AC
  Administered 2017-04-30: 100 mg via ORAL
  Filled 2017-04-30: qty 1

## 2017-04-30 MED ORDER — ORAL CARE MOUTH RINSE
15.0000 mL | Freq: Two times a day (BID) | OROMUCOSAL | Status: DC
  Administered 2017-04-30 – 2017-05-03 (×5): 15 mL via OROMUCOSAL

## 2017-04-30 MED ORDER — ARFORMOTEROL TARTRATE 15 MCG/2ML IN NEBU
2.0000 mL | INHALATION_SOLUTION | Freq: Two times a day (BID) | RESPIRATORY_TRACT | Status: DC
  Administered 2017-04-30 – 2017-05-04 (×8): 15 ug via RESPIRATORY_TRACT
  Filled 2017-04-30 (×9): qty 2

## 2017-04-30 MED ORDER — POLYETHYLENE GLYCOL 3350 17 G PO PACK
17.0000 g | PACK | Freq: Every day | ORAL | Status: DC | PRN
  Administered 2017-04-30 – 2017-05-02 (×2): 17 g via ORAL
  Filled 2017-04-30 (×2): qty 1

## 2017-04-30 MED ORDER — DOCUSATE SODIUM 100 MG PO CAPS
100.0000 mg | ORAL_CAPSULE | Freq: Two times a day (BID) | ORAL | Status: DC
  Administered 2017-04-30 – 2017-05-04 (×8): 100 mg via ORAL
  Filled 2017-04-30 (×9): qty 1

## 2017-04-30 MED ORDER — SODIUM CHLORIDE 0.9 % IV SOLN
Freq: Once | INTRAVENOUS | Status: AC
  Administered 2017-04-30: 15:00:00 via INTRAVENOUS

## 2017-04-30 MED ORDER — TRAMADOL HCL 50 MG PO TABS: 50.0000 mg | ORAL_TABLET | Freq: Four times a day (QID) | ORAL | Status: DC | PRN

## 2017-04-30 MED ORDER — ONDANSETRON HCL 4 MG PO TABS
4.0000 mg | ORAL_TABLET | Freq: Four times a day (QID) | ORAL | Status: DC | PRN
  Filled 2017-04-30: qty 1

## 2017-04-30 NOTE — ED Triage Notes (Signed)
Pt arrived via EMS from home with reports of multiple falls today and respiratory distress since last night. Pt wears 4L continuous oxygen at home has hx of COPD.  Pt c/o chronic right hip pain also did not take meds. 2 DUONEBS and 125 SOLUMEDROL with EMS. Pt alert and oriented on arrival.

## 2017-04-30 NOTE — ED Provider Notes (Signed)
[facility] Emergency Department Provider Note ____________________________________________   I have reviewed the triage vital signs and the triage nursing note.  HISTORY  Chief Complaint Respiratory Distress   Historian Patient  HPI Gerald EhlersRobert L Ortiz is a 83 y.o. male presents by EMS for respiratory distress.  History of COPD and home 4L [place], worsening dyspneawith white sputum and cough.  States passed out walking to bathroom twice this morning.  No serious injury.  On chronic prednisone, not recently on burst therapy or admitted for copd.  No chest pain.  Denies fevers.  Severe shortness of breath prior to calling EMS. Received slight Medrol as well as DuoNeb treatments on the way over and is feeling somewhat better from that. No additional hypoxia from his baseline where he is now currently around 95% on 4 L which is his home level.   Past Medical History:  Diagnosis Date  . Anxiety   . COPD (chronic obstructive pulmonary disease) (HCC)   . Depression   . Dysphagia   . Esophageal spasm   . Spinal stenosis     There are no active problems to display for this patient.   History reviewed. No pertinent surgical history.  Prior to Admission medications   Medication Sig Start Date End Date Taking? Authorizing Provider  acetaminophen (TYLENOL) 500 MG tablet Take 500 mg by mouth every 6 (six) hours as needed for mild pain, moderate pain or fever.    Yes [provider]  albuterol (PROAIR HFA) 108 (90 Base) MCG/ACT inhaler Inhale 2 puffs into the lungs every 6 (six) hours as needed for wheezing.    Yes [provider]  albuterol (PROVENTIL) (2.5 MG/3ML) 0.083% nebulizer solution Take 3 mLs by nebulization every 6 (six) hours as needed for wheezing.    Yes [provider]  ALPRAZolam Prudy Feeler(XANAX) 1 MG tablet Take 1 mg by mouth 2 (two) times daily as needed for sleep.    Yes [provider]  arformoterol (BROVANA) 15 MCG/2ML NEBU  Inhale 2 mLs into the lungs every 12 (twelve) hours.   Yes [provider]  aspirin EC 81 MG tablet Take 81 mg by mouth daily as needed for mild pain or moderate pain.    Yes [provider]  budesonide (PULMICORT) 0.5 MG/2ML nebulizer solution Take 2 mLs by nebulization every 12 (twelve) hours.   Yes [provider]  HYDROcodone-acetaminophen (NORCO/VICODIN) 5-325 MG tablet Take 1 tablet by mouth 2 (two) times daily as needed. 04/21/17  Yes [provider]  Multiple Vitamin (MULTIVITAMIN WITH MINERALS) TABS tablet Take 1 tablet by mouth daily.   Yes [provider]  pantoprazole (PROTONIX) 40 MG tablet Take 40 mg by mouth daily. 03/28/17  Yes [provider]  predniSONE (DELTASONE) 5 MG tablet Take 5 mg by mouth daily.   Yes [provider]  sertraline (ZOLOFT) 25 MG tablet Take 25 mg by mouth daily. 01/27/17  Yes [provider]  theophylline (THEODUR) 300 MG 12 hr tablet Take 300 mg by mouth daily.   Yes [provider]  tiotropium (SPIRIVA HANDIHALER) 18 MCG inhalation capsule Place 1 capsule into inhaler and inhale daily.   Yes [provider]  traMADol (ULTRAM) 50 MG tablet Take 50 mg by mouth every 6 (six) hours as needed for moderate pain.    Yes [provider]    No Known Allergies  History reviewed. No pertinent family history.  Social History Social History  Substance Use Topics  .  Smoking status: Former Games developer  . Smokeless tobacco: Never Used  . Alcohol use Yes    Review of Systems  Constitutional: Negative for fever. Eyes: Negative for visual changes. ENT: Negative for sore throat. Cardiovascular: Negative for chest pain. Respiratory: Positive for shortness of breath. Gastrointestinal: Negative for abdominal pain, vomiting and diarrhea. Genitourinary: Negative for dysuria. Musculoskeletal: Negative for back pain. Skin: Negative for rash. Neurological: Negative for  headache.  ____________________________________________   PHYSICAL EXAM:  VITAL SIGNS: ED Triage Vitals  Enc Vitals Group     BP 04/30/17 0942 (!) 114/58     Pulse Rate 04/30/17 0934 (!) 104     Resp 04/30/17 0934 16     Temp 04/30/17 0942 99.5 F (37.5 C)     Temp Source 04/30/17 0942 Oral     SpO2 04/30/17 0929 99 %     Weight 04/30/17 0935 140 lb (63.5 kg)     Height 04/30/17 0935 5\' 6"  (1.676 m)     Head Circumference --      Peak Flow --      Pain Score 04/30/17 0932 2     Pain Loc --      Pain Edu? --      Excl. in GC? --      Constitutional: Alert and oriented. Moderate distress. HEENT   Head: Normocephalic and atraumatic.      Eyes: Conjunctivae are normal. Pupils equal and round.       Ears:         Nose: No congestion/rhinnorhea.   Mouth/Throat: Mucous membranes are moist.   Neck: No stridor. Cardiovascular/Chest: Normal rate, regular rhythm.  No murmurs, rubs, or gallops. Respiratory:tachypnea with mild substernal retractions. Decreased breath sounds throughout with mild wheezing and persistent. Gastrointestinal: Soft. No distention, no guarding, no rebound. Nontender.    Genitourinary/rectal:Deferred Musculoskeletal: Nontender with normal range of motion in all extremities. No joint effusions.  No lower extremity tenderness.  No edema. Neurologic:  Normal speech and language. No gross or focal neurologic deficits are appreciated. Skin:  Skin is warm, dry and intact. No rash noted. Psychiatric: Mood and affect are normal. Speech and behavior are normal. Patient exhibits appropriate insight and judgment.   ____________________________________________  LABS (pertinent positives/negatives)  Labs Reviewed  BASIC METABOLIC PANEL - Abnormal; Notable for the following:       Result Value   Chloride 95 (*)    CO2 42 (*)    Glucose, Bld 104 (*)    Anion gap 4 (*)    All other components within normal limits  CBC - Abnormal; Notable for the  following:    RBC 4.10 (*)    Hemoglobin 11.8 (*)    HCT 35.8 (*)    All other components within normal limits  TROPONIN I    ____________________________________________    EKG I, Governor Rooks, MD, the attending physician have personally viewed and interpreted all ECGs.  103 bpm. Sinus tachycardia. Narrow QRS. Normal axis. Nonspecific ST and T-wave ____________________________________________  RADIOLOGY All Xrays were viewed by me. Imaging interpreted by Radiologist.  Chest x-ray one view portable:  IMPRESSION: No acute cardiopulmonary disease.  Emphysematous change.  Aortic atherosclerosis. __________________________________________  PROCEDURES  Procedure(s) performed: None  Critical Care performed: CRITICAL CARE Performed by: Governor Rooks   Total critical care time: 30 minutes  Critical care time was exclusive of separately billable procedures and treating other patients.  Critical care was necessary to treat or prevent imminent or life-threatening deterioration.  Critical care  was time spent personally by me on the following activities: development of treatment plan with patient and/or surrogate as well as nursing, discussions with consultants, evaluation of patient's response to treatment, examination of patient, obtaining history from patient or surrogate, ordering and performing treatments and interventions, ordering and review of laboratory studies, ordering and review of radiographic studies, pulse oximetry and re-evaluation of patient's condition.   ____________________________________________   ED COURSE / ASSESSMENT AND PLAN  Pertinent labs & imaging results that were available during my care of the patient were reviewed by me and considered in my medical decision making (see chart for details).    Mr. Bushong presents by EMS for respiratory distress. Clinically seems consistent with likely COPD exacerbation. He does have chronic lung disease and  is on 4 L nasal cannula, with such tenderness lungs at baseline, he does seem to be having a sig to the emergency room al as Solu-Medrol. He received an additional albuterol nent with some significant improvement after these 3, but still dyspneic and especially having trouble with talking. He is not hypoxic to his home 4 L nasal cannula, and left him on that.  No additional symptoms concerning for pneumonia, but given CVA and going to cover him  With doxycycline.  Doses laboratory investigation is reassuring. Chest x-ray does not show focal infiltrate.  Discussed with hospitalist for admission.    CONSULTATIONS:   Dr. Eliane Decree, hospitalist for admission.  Patient / Family / Caregiver informed of clinical course, medical decision-making process, and agree with plan.  ___________________________________________   FINAL CLINICAL IMPRESSION(S) / ED DIAGNOSES   Final diagnoses:  COPD exacerbation (HCC)  Respiratory distress              Note: This dictation was prepared with Dragon dictation. Any transcriptional errors that result from this process are unintentional    Governor Rooks, MD 04/30/17 1123

## 2017-04-30 NOTE — Plan of Care (Signed)
Problem: Pain Managment: Goal: General experience of comfort will improve Outcome: Progressing Norco given once for chronic R hip pain with improvement.  Problem: Physical Regulation: Goal: Ability to maintain clinical measurements within normal limits will improve Outcome: Progressing Pt admitted from the ED. Continue on 4L O2 per [place] as at home. O2 sats in the high 90's. Pt and family requested xanax for sleep and anxiety. Xanax given with improvement.  Problem: Bowel/Gastric: Goal: Will not experience complications related to bowel motility Outcome: Progressing Last BM on 04/28/17. Pt c/o constipation. Miralax given.

## 2017-04-30 NOTE — ED Notes (Signed)
Gave patient a ginger ale 

## 2017-04-30 NOTE — H&P (Signed)
[facility] Physicians - [place] at [facility]   PATIENT NAME: Gerald Ortiz    MR#:  [id number]  DATE OF BIRTH:  1932-08-25  DATE OF ADMISSION:  04/30/2017  PRIMARY CARE PHYSICIAN: Mick Sell, MD   REQUESTING/REFERRING PHYSICIAN: Dr. Shaune Pollack  CHIEF COMPLAINT:   I had a near passing out spell today 2 HISTORY OF PRESENT ILLNESS:  Gerald Ortiz  is a 83 y.o. male with a known history of End-stage emphysema on 4 L nasal cannula oxygen, spinal stenosis with chronic back pain on chronic narcotics, depression/anxiety comes to the emergency room accompanied by 2 daughters with above chief complaint. Patient states he was not feeling well yesterday he felt weak. He reports not drinking much water. He woke up this morning to go use the bathroom and sat on the commode and had a near passing out spell. Thereafter he walked from the bedroom to the living area to get his oxygen connected or in the living area and got short winded and had a near passing out spell. He remained conscious throughout. His legs gave out and he felt was a bruise on the right elbow. At present he is hemodynamically stable feeling better. He is being admitted for further evaluation and management for near syncope and COPD exacerbation.  Patient takes steroids on a daily basis. Currently he is not wheezing. His sats are stable. Few nebulizer treatments helped him in the ER.  PAST MEDICAL HISTORY:   Past Medical History:  Diagnosis Date  . Anxiety   . COPD (chronic obstructive pulmonary disease) (HCC)   . Depression   . Dysphagia   . Esophageal spasm   . Spinal stenosis     PAST SURGICAL HISTOIRY:  History reviewed. No pertinent surgical history.  SOCIAL HISTORY:   Social History  Substance Use Topics  . Smoking status: Former Games developer  . Smokeless tobacco: Never Used  . Alcohol use Yes    FAMILY HISTORY:  History reviewed. No pertinent family history.  DRUG ALLERGIES:  No Known  Allergies  REVIEW OF SYSTEMS:  Review of Systems  Constitutional: Negative for chills, fever and weight loss.  HENT: Negative for ear discharge, ear pain and nosebleeds.   Eyes: Negative for blurred vision, pain and discharge.  Respiratory: Positive for shortness of breath. Negative for sputum production, wheezing and stridor.   Cardiovascular: Negative for chest pain, palpitations, orthopnea and PND.  Gastrointestinal: Negative for abdominal pain, diarrhea, nausea and vomiting.  Genitourinary: Negative for frequency and urgency.  Musculoskeletal: Negative for back pain and joint pain.  Neurological: Positive for weakness. Negative for sensory change, speech change and focal weakness.  Psychiatric/Behavioral: Negative for depression and hallucinations. The patient is not nervous/anxious.      MEDICATIONS AT HOME:   Prior to Admission medications   Medication Sig Start Date End Date Taking? Authorizing Provider  acetaminophen (TYLENOL) 500 MG tablet Take 500 mg by mouth every 6 (six) hours as needed for mild pain, moderate pain or fever.    Yes [provider]  albuterol (PROAIR HFA) 108 (90 Base) MCG/ACT inhaler Inhale 2 puffs into the lungs every 6 (six) hours as needed for wheezing.    Yes [provider]  albuterol (PROVENTIL) (2.5 MG/3ML) 0.083% nebulizer solution Take 3 mLs by nebulization every 6 (six) hours as needed for wheezing.    Yes [provider]  ALPRAZolam Prudy Feeler) 1 MG tablet Take 1 mg by mouth 2 (two) times daily as needed for sleep.  Yes [provider]  arformoterol (BROVANA) 15 MCG/2ML NEBU Inhale 2 mLs into the lungs every 12 (twelve) hours.   Yes [provider]  aspirin EC 81 MG tablet Take 81 mg by mouth daily as needed for mild pain or moderate pain.    Yes [provider]  budesonide (PULMICORT) 0.5 MG/2ML nebulizer solution Take 2 mLs by nebulization every 12 (twelve) hours.   Yes [provider]   HYDROcodone-acetaminophen (NORCO/VICODIN) 5-325 MG tablet Take 1 tablet by mouth 2 (two) times daily as needed. 04/21/17  Yes [provider]  Multiple Vitamin (MULTIVITAMIN WITH MINERALS) TABS tablet Take 1 tablet by mouth daily.   Yes [provider]  pantoprazole (PROTONIX) 40 MG tablet Take 40 mg by mouth daily. 03/28/17  Yes [provider]  predniSONE (DELTASONE) 5 MG tablet Take 5 mg by mouth daily.   Yes [provider]  sertraline (ZOLOFT) 25 MG tablet Take 25 mg by mouth daily. 01/27/17  Yes [provider]  theophylline (THEODUR) 300 MG 12 hr tablet Take 300 mg by mouth daily.   Yes [provider]  tiotropium (SPIRIVA HANDIHALER) 18 MCG inhalation capsule Place 1 capsule into inhaler and inhale daily.   Yes [provider]  traMADol (ULTRAM) 50 MG tablet Take 50 mg by mouth every 6 (six) hours as needed for moderate pain.    Yes [provider]      VITAL SIGNS:  Blood pressure (!) 117/55, pulse 99, temperature 99.5 F (37.5 C), temperature source Oral, resp. rate (!) 22, height 5\' 6"  (1.676 m), weight 63.5 kg (140 lb), SpO2 100 %.  PHYSICAL EXAMINATION:  GENERAL:  83 y.o.-year-old patient lying in the bed with no acute distress. Thin cachectic EYES: Pupils equal, round, reactive to light and accommodation. No scleral icterus. Extraocular muscles intact.  HEENT: Head atraumatic, normocephalic. Oropharynx and nasopharynx clear. Oral mucosa dry NECK:  Supple, no jugular venous distention. No thyroid enlargement, no tenderness.  LUNGS: Distant breath sounds bilaterally, no wheezing, rales,rhonchi or crepitation. No use of accessory muscles of respiration. Emphysematous chest CARDIOVASCULAR: S1, S2 normal. No murmurs, rubs, or gallops. Mild tachycardia ABDOMEN: Soft, nontender, nondistended. Bowel sounds present. No organomegaly or mass.  EXTREMITIES: No pedal edema, cyanosis, or clubbing.  NEUROLOGIC: Cranial  nerves II through XII are intact. Muscle strength 5/5 in all extremities. Sensation intact. Gait not checked. Subjectively appears weak PSYCHIATRIC: The patient is alert and oriented x 3.  SKIN: No obvious rash, lesion, or ulcer.   LABORATORY PANEL:   CBC  Recent Labs Lab 04/30/17 0935  WBC 10.6  HGB 11.8*  HCT 35.8*  PLT 248   ------------------------------------------------------------------------------------------------------------------  Chemistries   Recent Labs Lab 04/30/17 0935  NA 141  K 4.1  CL 95*  CO2 42*  GLUCOSE 104*  BUN 14  CREATININE 0.63  CALCIUM 9.2   ------------------------------------------------------------------------------------------------------------------  Cardiac Enzymes  Recent Labs Lab 04/30/17 0946  TROPONINI <0.03   ------------------------------------------------------------------------------------------------------------------  RADIOLOGY:  Dg Chest Portable 1 View  Result Date: 04/30/2017 CLINICAL DATA:  Multiple falls today with respiratory distress since last night. Oxygen dependent. EXAM: PORTABLE CHEST 1 VIEW COMPARISON:  None. FINDINGS: Lungs are hyperexpanded with increased lucency suggesting emphysema. No focal airspace consolidation or effusion. Minimal scarring right base. Cardiomediastinal silhouette is within normal. There is mild calcified plaque over the aortic arch. Minimal degenerative change of the spine. IMPRESSION: No acute cardiopulmonary disease. Emphysematous change. Aortic atherosclerosis. Electronically Signed   By: Elberta Fortis M.D.  On: 04/30/2017 09:59    EKG:   Sinus tachycardia IMPRESSION AND PLAN:   Gerald Ortiz  is a 83 y.o. male with a known history of End-stage emphysema on 4 L nasal cannula oxygen, spinal stenosis with chronic back pain on chronic narcotics, depression/anxiety comes to the emergency room accompanied by 2 daughters with above chief complaint. Patient states he was not feeling  well yesterday he felt weak. He reports not drinking much water. He woke up this morning to go use the bathroom and sat on the commode and had a near passing out spel  1. Near syncope suspected due to medical dehydration and transient hypoxia -Admitted for overnight observation -IV fluids -Physical therapy -Monitor on telemetry -Cycle cardiac enzymes 3  2. Acute on chronic hypoxic respiratory failure secondary to end-stage emphysema -Patient is steroid dependent on prednisone 5 mg daily. Currently not wheezing hold off on IV Solu-Medrol. We'll continue nebulizers and inhalers -He uses 4 L nasal cannula oxygen  3. Generalized weakness -Physical therapy to see patient  4. Lumbar spinal stenosis on chronic pain meds resumed home meds  5. DVT prophylaxis subcutaneous Lovenox  Above was discussed with patient and patient's daughters All the records are reviewed and case discussed with ED provider. Management plans discussed with the patient, family and they are in agreement.  CODE STATUS: partial code. Patient wants everything done but no mechanical ventilator  TOTAL TIME TAKING CARE OF THIS PATIENT: 45 minutes.    Gerald Ortiz M.D on 04/30/2017 at 12:14 PM  Between 7am to 6pm - Pager - [phone]  After 6pm go to [email or web address] - password [email or web address] [facility]  [facility]  Office  [phone]  CC: Primary care physician; Mick SellFitzgerald, David P, MD

## 2017-05-01 ENCOUNTER — Observation Stay: Payer: Medicare Other

## 2017-05-01 ENCOUNTER — Observation Stay
Admit: 2017-05-01 | Discharge: 2017-05-01 | Disposition: A | Discharge status: Home / Self Care | Payer: Medicare Other | Attending: Internal Medicine | Admitting: Internal Medicine

## 2017-05-01 DIAGNOSIS — K5900 Constipation, unspecified: Secondary | ICD-10-CM

## 2017-05-01 DIAGNOSIS — I951 Orthostatic hypotension: Secondary | ICD-10-CM

## 2017-05-01 DIAGNOSIS — E860 Dehydration: Secondary | ICD-10-CM

## 2017-05-01 DIAGNOSIS — R55 Syncope and collapse: Secondary | ICD-10-CM

## 2017-05-01 LAB — BLOOD GAS, ARTERIAL
Acid-Base Excess: 15.2 mmol/L — ABNORMAL HIGH (ref 0.0–2.0)
Bicarbonate: 42.2 mmol/L — ABNORMAL HIGH (ref 20.0–28.0)
FIO2: 0.36
O2 SAT: 95.6 %
PATIENT TEMPERATURE: 37
pCO2 arterial: 65 mmHg — ABNORMAL HIGH (ref 32.0–48.0)
pH, Arterial: 7.42 (ref 7.350–7.450)
pO2, Arterial: 78 mmHg — ABNORMAL LOW (ref 83.0–108.0)

## 2017-05-01 LAB — ECHOCARDIOGRAM COMPLETE
HEIGHTINCHES: 66 in
WEIGHTICAEL: 2240 [oz_av]

## 2017-05-01 MED ORDER — BISACODYL 10 MG RE SUPP
10.0000 mg | Freq: Once | RECTAL | Status: AC
  Administered 2017-05-01: 10 mg via RECTAL
  Filled 2017-05-01: qty 1

## 2017-05-01 MED ORDER — HYDROCODONE-ACETAMINOPHEN 5-325 MG PO TABS
1.0000 | ORAL_TABLET | Freq: Two times a day (BID) | ORAL | Status: DC | PRN
  Administered 2017-05-02 – 2017-05-04 (×4): 1 via ORAL
  Filled 2017-05-01 (×6): qty 1

## 2017-05-01 MED ORDER — SODIUM CHLORIDE 0.9 % IV BOLUS (SEPSIS)
500.0000 mL | Freq: Once | INTRAVENOUS | Status: AC
  Administered 2017-05-01: 14:00:00 500 mL via INTRAVENOUS

## 2017-05-01 NOTE — Progress Notes (Signed)
[facility] Physicians - [place] at [facility]   PATIENT NAME: Gerald Ortiz    MR#:  [id number]  DATE OF BIRTH:  1932-01-28  SUBJECTIVE:  CHIEF COMPLAINT:   Chief Complaint  Patient presents with  . Respiratory Distress   The patient is a 83 year old Caucasian male with past medical history significant for history of end-stage COPD, chronic oxygen at 4 L, spinal stenosis, chronic back pain, on opiates, depression, anxiety, who presents to the hospital with complaints of near syncopal episode 2 on the day of admission, weakness. He reported not eating or drinking well due to constipation. Labs revealed mild anemia. Patient was admitted. Orthostatic vital signs were positive with blood pressure dropping down to 98 systolic from 159 whenever patient stood up. No weight loss.  Review of Systems  Constitutional: Negative for chills, fever and weight loss.  HENT: Negative for congestion.   Eyes: Negative for blurred vision and double vision.  Respiratory: Negative for cough, sputum production, shortness of breath and wheezing.   Cardiovascular: Negative for chest pain, palpitations, orthopnea, leg swelling and PND.  Gastrointestinal: Positive for abdominal pain, constipation and nausea. Negative for blood in stool, diarrhea and vomiting.  Genitourinary: Negative for dysuria, frequency, hematuria and urgency.  Musculoskeletal: Negative for falls.  Neurological: Negative for dizziness, tremors, focal weakness and headaches.  Endo/Heme/Allergies: Does not bruise/bleed easily.  Psychiatric/Behavioral: Negative for depression. The patient does not have insomnia.     VITAL SIGNS: Blood pressure (!) 98/59, pulse 86, temperature 97.9 F (36.6 C), resp. rate 20, height 5\' 6"  (1.676 m), weight 63.5 kg (140 lb), SpO2 99 %.  PHYSICAL EXAMINATION:   GENERAL:  83 y.o.-year-old patient lying in the bed with no acute distress.  EYES: Pupils equal, round, reactive to light and  accommodation. No scleral icterus. Extraocular muscles intact.  HEENT: Head atraumatic, normocephalic. Oropharynx and nasopharynx clear.  NECK:  Supple, no jugular venous distention. No thyroid enlargement, no tenderness.  LUNGS: Diminished breath sounds bilaterally, scattered diffuse wheezing, no rales,rhonchi or crepitation. Intermittent use of accessory muscles of respiration.  CARDIOVASCULAR: S1, S2 normal. No murmurs, rubs, or gallops.  ABDOMEN: Soft, tender diffusely, mostly in right lower quadrant and suprapubically, no rebound or guarding, abdomen is nondistended. Bowel sounds  diminished. No organomegaly or mass.  EXTREMITIES: No pedal edema, cyanosis, or clubbing.  NEUROLOGIC: Cranial nerves II through XII are intact. Muscle strength 5/5 in all extremities. Sensation intact. Gait not checked.  PSYCHIATRIC: The patient is alert and oriented x 3.  SKIN: No obvious rash, lesion, or ulcer.   ORDERS/RESULTS REVIEWED:   CBC  Recent Labs Lab 04/30/17 0935  WBC 10.6  HGB 11.8*  HCT 35.8*  PLT 248  MCV 87.4  MCH 28.8  MCHC 32.9  RDW 13.5   ------------------------------------------------------------------------------------------------------------------  Chemistries   Recent Labs Lab 04/30/17 0935  NA 141  K 4.1  CL 95*  CO2 42*  GLUCOSE 104*  BUN 14  CREATININE 0.63  CALCIUM 9.2   ------------------------------------------------------------------------------------------------------------------ estimated creatinine clearance is 63.9 mL/min (by C-G formula based on SCr of 0.63 mg/dL). ------------------------------------------------------------------------------------------------------------------ No results for input(s): TSH, T4TOTAL, T3FREE, THYROIDAB in the last 72 hours.  Invalid input(s): FREET3  Cardiac Enzymes  Recent Labs Lab 04/30/17 0946 04/30/17 1556 04/30/17 2140  TROPONINI <0.03 <0.03 <0.03    ------------------------------------------------------------------------------------------------------------------ Invalid input(s): POCBNP ---------------------------------------------------------------------------------------------------------------  RADIOLOGY: Dg Abd 1 View  Result Date: 05/01/2017 CLINICAL DATA:  Diffuse abdominal pain EXAM: ABDOMEN - 1 VIEW COMPARISON:  None. FINDINGS: Scattered  large and small bowel gas is noted. Fecal material is noted throughout the colon consistent with a mild degree of constipation. No obstructive changes are seen. No free air is noted. Degenerative changes of the lumbar spine are seen. IMPRESSION: Mild constipation. Electronically Signed   By: Alcide CleverMark  Lukens M.D.   On: 05/01/2017 09:24   [place] Abdomen Complete  Result Date: 05/01/2017 CLINICAL DATA:  Right upper quadrant pain EXAM: ABDOMEN ULTRASOUND COMPLETE COMPARISON:  None. FINDINGS: Gallbladder: Multiple gallstones identified measuring up to about 15 mm. Sonographer reports no sonographic Murphy sign. No pericholecystic fluid. Gallbladder wall thickness within normal limits. Common bile duct: Diameter: 3 mm, within normal limits. Liver: No focal lesion identified. Within normal limits in parenchymal echogenicity. IVC: No abnormality visualized. Pancreas: Largely obscured by overlying bowel gas. Spleen: Size and appearance within normal limits. Right Kidney: Length: 9.6 cm. Echogenicity within normal limits. No mass or hydronephrosis visualized. Left Kidney: Length: 10.1 cm. Echogenicity within normal limits. No mass or hydronephrosis visualized. Abdominal aorta: No aneurysm visualized. Other findings: None. IMPRESSION: Cholelithiasis without gallbladder wall thickening or pericholecystic fluid. No associated biliary dilatation. Electronically Signed   By: Kennith CenterEric  Mansell M.D.   On: 05/01/2017 10:52   Dg Chest Portable 1 View  Result Date: 04/30/2017 CLINICAL DATA:  Multiple falls today with respiratory  distress since last night. Oxygen dependent. EXAM: PORTABLE CHEST 1 VIEW COMPARISON:  None. FINDINGS: Lungs are hyperexpanded with increased lucency suggesting emphysema. No focal airspace consolidation or effusion. Minimal scarring right base. Cardiomediastinal silhouette is within normal. There is mild calcified plaque over the aortic arch. Minimal degenerative change of the spine. IMPRESSION: No acute cardiopulmonary disease. Emphysematous change. Aortic atherosclerosis. Electronically Signed   By: Elberta Fortisaniel  Boyle M.D.   On: 04/30/2017 09:59    EKG:  Orders placed or performed during the hospital encounter of 04/30/17  . ED EKG  . ED EKG  . EKG 12-Lead  . EKG 12-Lead    ASSESSMENT AND PLAN:  Active Problems:   COPD (chronic obstructive pulmonary disease) (HCC)   Near syncope   Orthostatic hypotension   Dehydration   Constipation  #1. Near syncope, likely due to orthostatic hypotension/dehydration, patient will be rehydrated, follow oral intake, get carotid ultrasound, echocardiogram #2. Orthostatic hypotension, continue IV fluids, following blood pressure readings closely, likely related to recent poor oral intake #3. Constipation due to opiates, initiate patient on Colace, Dulcolax, MiraLAX, follow closely #4. Failure to thrive, adult, likely due to constipation, follow oral intake after stool output #5. COPD, continue outpatient medications, no changes, no obvious infection, patient's oxygen level is high on 4 L, getting ABGs to rule out baseline CO2 retention    Management plans discussed with the patient, family and they are in agreement.   DRUG ALLERGIES: No Known Allergies  CODE STATUS:     Code Status Orders        Start     Ordered   04/30/17 1324  Limited resuscitation (code)  Continuous    Question Answer Comment  In the event of cardiac or respiratory ARREST: Initiate Code Blue, Call Rapid Response Yes   In the event of cardiac or respiratory ARREST: Perform CPR  Yes   In the event of cardiac or respiratory ARREST: Perform Intubation/Mechanical Ventilation No   In the event of cardiac or respiratory ARREST: Use NIPPV/BiPAp only if indicated Yes   In the event of cardiac or respiratory ARREST: Administer ACLS medications if indicated Yes   In the event of cardiac  or respiratory ARREST: Perform Defibrillation or Cardioversion if indicated Yes      04/30/17 1323    Code Status History    Date Active Date Inactive Code Status Order ID Comments User Context   This patient has a current code status but no historical code status.    Advance Directive Documentation     Most Recent Value  Type of Advance Directive  Living will, Healthcare Power of Attorney  Pre-existing out of facility DNR order (yellow form or pink MOST form)  -  "MOST" Form in Place?  -      TOTAL TIME TAKING CARE OF THIS PATIENT: 40 minutes.    Katharina Caper M.D on 05/01/2017 at 1:44 PM  Between 7am to 6pm - Pager - [phone]  After 6pm go to [email or web address] - password [email or web address] [facility]  [place] [facility]  Office  [phone]  CC: Primary care physician; Mick Sell, MD

## 2017-05-01 NOTE — Care Management Obs Status (Signed)
MEDICARE OBSERVATION STATUS NOTIFICATION   Patient Details  Name: Gerald Ortiz MRN: [id number] Date of Birth: 07-15-1932   Medicare Observation Status Notification Given:  Yes    Gwenette GreetBrenda S Kenyata Guess, RN 05/01/2017, 9:22 AM

## 2017-05-01 NOTE — NC FL2 (Signed)
[place] MEDICAID FL2 LEVEL OF CARE SCREENING TOOL     IDENTIFICATION  Patient Name: Gerald Ortiz Birthdate: Aug 02, 1932 Sex: male Admission Date (Current Location): 04/30/2017  [place] Number:  Chiropodist and Address:  [facility], [address], [place], [place] [address]      Provider Number: [id number]  Attending Physician Name and Address:  Katharina Caper, MD  Relative Name and Phone Number:       Current Level of Care: Hospital Recommended Level of Care: Skilled Nursing Facility Prior Approval Number:    Date Approved/Denied:   PASRR Number:  [id number])  Discharge Plan: SNF    Current Diagnoses: Patient Active Problem List   Diagnosis Date Noted  . Near syncope 05/01/2017  . Orthostatic hypotension 05/01/2017  . Dehydration 05/01/2017  . Constipation 05/01/2017  . COPD (chronic obstructive pulmonary disease) (HCC) 04/30/2017    Orientation RESPIRATION BLADDER Height & Weight     Self, Time, Situation, Place  O2 (4 Liters Oxygen ) Continent Weight: 140 lb (63.5 kg) Height:  5\' 6"  (167.6 cm)  BEHAVIORAL SYMPTOMS/MOOD NEUROLOGICAL BOWEL NUTRITION STATUS   (none)  (none) Continent Diet (Diet: Soft )  AMBULATORY STATUS COMMUNICATION OF NEEDS Skin   Extensive Assist Verbally Normal                       Personal Care Assistance Level of Assistance  Bathing, Feeding, Dressing Bathing Assistance: Limited assistance Feeding assistance: Independent Dressing Assistance: Limited assistance     Functional Limitations Info  Sight, Hearing, Speech Sight Info: Adequate Hearing Info: Impaired Speech Info: Adequate    SPECIAL CARE FACTORS FREQUENCY  PT (By licensed PT), OT (By licensed OT)     PT Frequency:  (5) OT Frequency:  (5)            Contractures      Additional Factors Info  Code Status, Allergies Code Status Info:  (Partial Code. ) Allergies Info:  (No Known Allergies. )            Current Medications (05/01/2017):  This is the current hospital active medication list Current Facility-Administered Medications  Medication Dose Route Frequency Provider Last Rate Last Dose  . acetaminophen (TYLENOL) tablet 650 mg  650 mg Oral Q6H PRN Enedina Finner, MD       Or  . acetaminophen (TYLENOL) suppository 650 mg  650 mg Rectal Q6H PRN Enedina Finner, MD      . albuterol (PROVENTIL) (2.5 MG/3ML) 0.083% nebulizer solution 3 mL  3 mL Nebulization Q6H PRN Enedina Finner, MD   3 mL at 05/01/17 0245  . ALPRAZolam Prudy Feeler) tablet 1 mg  1 mg Oral BID PRN Enedina Finner, MD   1 mg at 05/01/17 1135  . arformoterol (BROVANA) nebulizer solution 15 mcg  2 mL Inhalation Q12H Enedina Finner, MD   15 mcg at 05/01/17 0745  . budesonide (PULMICORT) nebulizer solution 0.5 mg  2 mL Nebulization Q12H Enedina Finner, MD   0.5 mg at 05/01/17 0737  . docusate sodium (COLACE) capsule 100 mg  100 mg Oral BID Enedina Finner, MD   100 mg at 05/01/17 1134  . enoxaparin (LOVENOX) injection 40 mg  40 mg Subcutaneous Q24H Enedina Finner, MD   40 mg at 04/30/17 2032  . HYDROcodone-acetaminophen (NORCO/VICODIN) 5-325 MG per tablet 1 tablet  1 tablet Oral BID PRN Katharina Caper, MD      . MEDLINE mouth rinse  15 mL Mouth  Rinse BID Enedina FinnerPatel, Sona, MD   15 mL at 04/30/17 2033  . multivitamin with minerals tablet 1 tablet  1 tablet Oral Daily Enedina FinnerPatel, Sona, MD   1 tablet at 05/01/17 1135  . ondansetron (ZOFRAN) tablet 4 mg  4 mg Oral Q6H PRN Enedina FinnerPatel, Sona, MD       Or  . ondansetron [facility](ZOFRAN) injection 4 mg  4 mg Intravenous Q6H PRN Enedina FinnerPatel, Sona, MD      . pantoprazole (PROTONIX) EC tablet 40 mg  40 mg Oral Daily Enedina FinnerPatel, Sona, MD   40 mg at 05/01/17 1136  . polyethylene glycol (MIRALAX / GLYCOLAX) packet 17 g  17 g Oral Daily PRN Enedina FinnerPatel, Sona, MD   17 g at 04/30/17 1456  . predniSONE (DELTASONE) tablet 5 mg  5 mg Oral Daily Enedina FinnerPatel, Sona, MD   5 mg at 05/01/17 1136  . sertraline (ZOLOFT) tablet 25 mg  25 mg Oral Daily Enedina FinnerPatel, Sona, MD   25 mg at  05/01/17 1134  . sodium chloride 0.9 % bolus 500 mL  500 mL Intravenous Once Katharina CaperVaickute, Rima, MD 500 mL/hr at 05/01/17 1354 500 mL at 05/01/17 1354  . theophylline (THEO-24) 24 hr capsule 300 mg  300 mg Oral Daily Enedina FinnerPatel, Sona, MD   300 mg at 05/01/17 1135  . tiotropium (SPIRIVA) inhalation capsule 18 mcg  1 capsule Inhalation Daily Enedina FinnerPatel, Sona, MD   18 mcg at 05/01/17 1137  . traMADol (ULTRAM) tablet 50 mg  50 mg Oral Q6H PRN Enedina FinnerPatel, Sona, MD         Discharge Medications: Please see discharge summary for a list of discharge medications.  Relevant Imaging Results:  Relevant Lab Results:   Additional Information  (SSN: [id number])  Calley Drenning, Darleen CrockerBailey M, LCSW

## 2017-05-01 NOTE — Clinical Social Work Note (Signed)
Clinical Social Work Assessment  Patient Details  Name: Gerald Ortiz MRN: [id number] Date of Birth: Mar 28, 1932  Date of referral:  05/01/17               Reason for consult:  Facility Placement                Permission sought to share information with:  Chartered certified accountant granted to share information::  Yes, Verbal Permission Granted  Name::      [place]::   [place]   Relationship::     Contact Information:     Housing/Transportation Living arrangements for the past 2 months:  [place] of Information:  Adult Children Patient Interpreter Needed:  None Criminal Activity/Legal Involvement Pertinent to Current Situation/Hospitalization:  No - Comment as needed Significant Relationships:  Adult Children, Spouse Lives with:  Spouse Do you feel safe going back to the place where you live?  Yes Need for family participation in patient care:  Yes (Comment)  Care giving concerns:  Patient lives with his wife Gerald Ortiz in [place].    Social Worker assessment / plan:  Holiday representative (CSW) reviewed chart and noted that PT is recommending SNF. CSW met with patient and her his daughter Gerald Ortiz [phone] was at bedside. Patient was asleep and did not participate in assessment. Daughter reported that patient lives with his wife Gerald Ortiz in [place]. CSW explained SNF process. Daughter reported that patient is not fond of the idea of going to SNF but she will talk to him and his wife about it. Daughter is agreeable to SNF search in [place]. FL2 complete and faxed out. CSW will continue to follow and assist as needed.   Employment status:  Retired Nurse, adult PT Recommendations:  [place] / Referral to community resources:  [place]  Patient/Family's Response to care:  Patient's daughter Gerald Ortiz is agreeable to SNF search in  [place].   Patient/Family's Understanding of and Emotional Response to Diagnosis, Current Treatment, and Prognosis: Daughter was very pleasant and thanked CSW for visit.   Emotional Assessment Appearance:  Appears stated age Attitude/Demeanor/Rapport:  Unable to Assess Affect (typically observed):  Unable to Assess Orientation:  Oriented to Self, Oriented to Place, Oriented to  Time, Oriented to Situation Alcohol / Substance use:  Not Applicable Psych involvement (Current and /or in the community):  No (Comment)  Discharge Needs  Concerns to be addressed:  Discharge Planning Concerns Readmission within the last 30 days:  No Current discharge risk:  Dependent with Mobility Barriers to Discharge:  Continued Medical Work up   [employer], Veronia Beets, LCSW 05/01/2017, 2:38 PM

## 2017-05-01 NOTE — Progress Notes (Signed)
*  PRELIMINARY RESULTS* Echocardiogram 2D Echocardiogram has been performed.  Cristela BlueHege, Shambria Camerer 05/01/2017, 2:53 PM

## 2017-05-01 NOTE — Clinical Social Work Placement (Signed)
   CLINICAL SOCIAL WORK PLACEMENT  NOTE  Date:  05/01/2017  Patient Details  Name: Gerald Ortiz MRN: [id number] Date of Birth: Jul 27, 1932  Clinical Social Work is seeking post-discharge placement for this patient at the Skilled  Nursing Facility level of care (*CSW will initial, date and re-position this form in  chart as items are completed):  Yes   Patient/family provided with [employer] Clinical Social Work Department's list of facilities offering this level of care within the geographic area requested by the patient (or if unable, by the patient's family).  Yes   Patient/family informed of their freedom to choose among providers that offer the needed level of care, that participate in Medicare, Medicaid or managed care program needed by the patient, have an available bed and are willing to accept the patient.  Yes   Patient/family informed of [facility]'s ownership interest in [facility] and [facility], as well as of the fact that they are under no obligation to receive care at these facilities.  PASRR submitted to EDS on 05/01/17     PASRR number received on 05/01/17     Existing PASRR number confirmed on       FL2 transmitted to all facilities in geographic area requested by pt/family on 05/01/17     FL2 transmitted to all facilities within larger geographic area on       Patient informed that his/her managed care company has contracts with or will negotiate with certain facilities, including the following:            Patient/family informed of bed offers received.  Patient chooses bed at       Physician recommends and patient chooses bed at      Patient to be transferred to   on  .  Patient to be transferred to facility by       Patient family notified on   of transfer.  Name of family member notified:        PHYSICIAN       Additional Comment:    _______________________________________________ Melquiades Kovar, Darleen CrockerBailey M, LCSW 05/01/2017, 2:37 PM

## 2017-05-01 NOTE — Care Management (Signed)
Admitted to [facility] with the diagnosis of COPD under observation status. Lives with wife, Marylu LundJanet. Last seen Dr. Sampson GoonFitzgerald 02/21/17. Goes every 6 months. Daughter is Ulis RiasMichele Dull [phone])  Home oxygen per [facility] x 15 years. Uses 4 liters per nasal cannula continuous. No home Health. No Skilled nursing. Takes care of all basic activities of daily living himself, drives. Oxygen concentrator and nebulizer in the home. Last fall was 2 days ago, Good appetite. [facility] [address]. Family will transport. Gwenette GreetBrenda S Marty Sadlowski RN MSN CCM Care Management [phone]

## 2017-05-01 NOTE — Evaluation (Signed)
Physical Therapy Evaluation Patient Details Name: Gerald Ortiz MRN: [id number] DOB: April 16, 1932 Today's Date: 05/01/2017   History of Present Illness  Gerald Ortiz  is a 83 y.o. male with a known history of end-stage emphysema on 4 L nasal cannula oxygen, spinal stenosis with chronic back pain on chronic narcotics, depression/anxiety comes to the emergency room accompanied by 2 daughters with above chief complaint.  Clinical Impression  Pt admitted with above diagnosis. Pt currently with functional limitations due to the deficits listed below (see PT Problem List).  Pt is independent with bed mobility and transfers. He is able to ambulate with therapist in hallway with minA+1. As distance progresses pt with worsening DOE. He becomes increasingly unsteady and reports increase in dizziness symptoms. Pt becomes less response to questioning by therapist and it looks like he might fall at one point. RN brings chair to sit pt down but then he starts to recover and reports he can make it back to his room. After sitting down SaO2 at 86% on 4L/min. Pt requires approximately 90s of pursed lip breathing for SaO2 to increase to 88-89% on 4L/min O2. Eventually it recovers to 93%. Pt remains very anxious after he is back in bed. He would benefit from SNF placement but he adamantly refuses. If pt continues to refuse please arrange [facility] PT. He will also need a front-wheeled walker for added stability after discharge. Pt will benefit from skilled PT services to address deficits in strength, balance, and mobility in order to return to full function at home.     Follow Up Recommendations SNF;Other (comment) (Pt adamantly refuses, if refuses please arrange [facility] PT)    Equipment Recommendations  Rolling walker with 5" wheels    Recommendations for Other Services       Precautions / Restrictions Precautions Precautions: Fall Restrictions Weight Bearing Restrictions: No      Mobility  Bed Mobility Overal bed  mobility: Independent             General bed mobility comments: Fair speed/sequencingw with HOB eelvated and use of bed rails  Transfers Overall transfer level: Independent Equipment used: Rolling walker (2 wheeled)             General transfer comment: Slight increase in time required. Mild increase in sway in standing but able to self-correct  Ambulation/Gait Ambulation/Gait assistance: Min assist Ambulation Distance (Feet): 80 Feet Assistive device: Rolling walker (2 wheeled) Gait Pattern/deviations: Decreased step length - right;Decreased step length - left Gait velocity: Decreased Gait velocity interpretation: <1.8 ft/sec, indicative of risk for recurrent falls General Gait Details: Pt ambulates with therapist in hallway. As distance progresses pt with worsening SOB on 4L/min O2. He becomes increasingly unsteady and reports increase in dizziness symptoms. Pt becomes less response to questioning by therapist and it looks like he might fall at one point. RN brings chair to sit pt down but then he starts to recover and reports he can make it back to his room. After sitting down SaO2 at 86% on 4L/min. Pt requires approximately 90s of pursed lip breathing for SaO2 to increase to 88-89%. Eventually it recovers to 93% on supplemental O2  Stairs            Wheelchair Mobility    Modified Rankin (Stroke Patients Only)       Balance Overall balance assessment: Needs assistance Sitting-balance support: No upper extremity supported Sitting balance-Leahy Scale: Fair     Standing balance support: No upper extremity supported Standing balance-Leahy Scale:  Fair Standing balance comment: Able to maintain independent standing. Pt with significant increase in sway with feet together. Positive [place]. Single leg balance is approximately 2s bilaterally                             Pertinent Vitals/Pain Pain Assessment: 0-10 Pain Location: Pt reports some  abdominal pain but does not rate.  Pain Intervention(s): Monitored during session    Home Living Family/patient expects to be discharged to:: Unsure Living Arrangements: Spouse/significant other Available Help at Discharge: Family Type of Home: Mobile home Home Access: Stairs to enter Entrance Stairs-Rails: Right;Left;Can reach both Entrance Stairs-Number of Steps: 5 Home Layout: One level Home Equipment: Walker - 4 wheels;Shower seat;Grab bars - tub/shower      Prior Function Level of Independence: Needs assistance   Gait / Transfers Assistance Needed: Ambulates mostly houshold distances without assistive device. Pt has had multiple falls over the last 12 months but unable to quantify. He has had more recently due his pre-syncopal episodes. Pt still drives short distances  ADL's / Homemaking Assistance Needed: independent with ADLs, assist for IADLs from family. Daughter lives nearby and assists        Hand Dominance        Extremity/Trunk Assessment   Upper Extremity Assessment Upper Extremity Assessment: Overall WFL for tasks assessed    Lower Extremity Assessment Lower Extremity Assessment: Generalized weakness       Communication   Communication: No difficulties  Cognition Arousal/Alertness: Awake/alert Behavior During Therapy: Restless Overall Cognitive Status: Within Functional Limits for tasks assessed                                        General Comments      Exercises     Assessment/Plan    PT Assessment Patient needs continued PT services  PT Problem List Decreased strength;Decreased activity tolerance;Decreased mobility;Decreased balance;Decreased safety awareness       PT Treatment Interventions DME instruction;Gait training;Stair training;Functional mobility training;Therapeutic activities;Therapeutic exercise;Balance training;Patient/family education;Cognitive remediation;Neuromuscular re-education    PT Goals (Current  goals can be found in the Care Plan section)  Acute Rehab PT Goals Patient Stated Goal: "I'm going home." PT Goal Formulation: With patient/family Time For Goal Achievement: 05/15/17 Potential to Achieve Goals: Good    Frequency Min 2X/week   Barriers to discharge Decreased caregiver support Spouse is unable to provide adequate support. However pt has good support from family who are nearby    Co-evaluation               AM-PAC PT "6 Clicks" Daily Activity  Outcome Measure Difficulty turning over in bed (including adjusting bedclothes, sheets and blankets)?: None Difficulty moving from lying on back to sitting on the side of the bed? : None Difficulty sitting down on and standing up from a chair with arms (e.g., wheelchair, bedside commode, etc,.)?: None Help needed moving to and from a bed to chair (including a wheelchair)?: A Little Help needed walking in hospital room?: A Little Help needed climbing 3-5 steps with a railing? : A Lot 6 Click Score: 20    End of Session Equipment Utilized During Treatment: Gait belt;Oxygen;Other (comment) (4 L/min) Activity Tolerance: Patient tolerated treatment well Patient left: in bed;with call bell/phone within reach;with bed alarm set;with family/visitor present Nurse Communication: Mobility status PT Visit Diagnosis: Unsteadiness on feet (  R26.81);Repeated falls (R29.6);Muscle weakness (generalized) (M62.81)    Time: [id number] PT Time Calculation (min) (ACUTE ONLY): 28 min   Charges:   PT Evaluation $PT Eval Moderate Complexity: 1 Procedure PT Treatments $Gait Training: 8-22 mins   PT G Codes:   PT G-Codes **NOT FOR INPATIENT CLASS** Functional Assessment Tool Used: AM-PAC 6 Clicks Basic Mobility Functional Limitation: Mobility: Walking and moving around Mobility: Walking and Moving Around Current Status ([id number]([id number]): At least 20 percent but less than 40 percent impaired, limited or restricted Mobility: Walking and Moving Around  Goal Status [phone](G8979): At least 1 percent but less than 20 percent impaired, limited or restricted    Lynnea MaizesJason D Ariyannah Pauling PT, DPT    Laurella Tull 05/01/2017, 12:03 PM

## 2017-05-02 LAB — EXPECTORATED SPUTUM ASSESSMENT W GRAM STAIN, RFLX TO RESP C

## 2017-05-02 LAB — EXPECTORATED SPUTUM ASSESSMENT W REFEX TO RESP CULTURE

## 2017-05-02 MED ORDER — ALBUTEROL SULFATE (2.5 MG/3ML) 0.083% IN NEBU
3.0000 mL | INHALATION_SOLUTION | RESPIRATORY_TRACT | Status: DC
  Administered 2017-05-02 – 2017-05-04 (×11): 3 mL via RESPIRATORY_TRACT
  Filled 2017-05-02 (×13): qty 3

## 2017-05-02 MED ORDER — AZITHROMYCIN 250 MG PO TABS
250.0000 mg | ORAL_TABLET | Freq: Every day | ORAL | Status: DC
  Administered 2017-05-03 – 2017-05-04 (×2): 250 mg via ORAL
  Filled 2017-05-02 (×2): qty 1

## 2017-05-02 MED ORDER — AZITHROMYCIN 500 MG PO TABS
500.0000 mg | ORAL_TABLET | Freq: Every day | ORAL | Status: AC
  Administered 2017-05-02: 500 mg via ORAL
  Filled 2017-05-02: qty 1

## 2017-05-02 MED ORDER — METHYLPREDNISOLONE SODIUM SUCC 125 MG IJ SOLR
60.0000 mg | Freq: Four times a day (QID) | INTRAMUSCULAR | Status: DC
  Administered 2017-05-02 – 2017-05-04 (×7): 60 mg via INTRAVENOUS
  Filled 2017-05-02 (×3): qty 2
  Filled 2017-05-02: qty 0.96
  Filled 2017-05-02 (×2): qty 2
  Filled 2017-05-02: qty 0.96
  Filled 2017-05-02 (×2): qty 2

## 2017-05-02 MED ORDER — METHYLPREDNISOLONE SODIUM SUCC 125 MG IJ SOLR
125.0000 mg | Freq: Once | INTRAMUSCULAR | Status: AC
  Administered 2017-05-02: 125 mg via INTRAVENOUS
  Filled 2017-05-02: qty 2

## 2017-05-02 NOTE — Progress Notes (Signed)
Clinical Child psychotherapistocial Worker (CSW) received a call from patient's 2 daughters [name] and [name] stating that patient is agreeable to SNF and they have chosen [facility]. [facility] is aware of accepted bed offer. Plan is for patient to D/C to [facility] pending medical clearance. CSW will continue to follow and assist as needed.   [employer]iley Cayle [place], LCSW [phone]

## 2017-05-02 NOTE — Progress Notes (Signed)
Clinical Social Worker (CSW) presented bed offers to patient's daughter [name] at bedside. Patient was asleep. Per daughter patient is not happy about going to SNF and the family will discuss offers and get back to CSW.   [employer]Bailey Fantasha Daniele, LCSW [phone]

## 2017-05-02 NOTE — Progress Notes (Signed)
[facility] - [place] at [facility]   PATIENT NAME: Gerald Ortiz    MR#:  [id number]  DATE OF BIRTH:  [birth year]  SUBJECTIVE:  CHIEF COMPLAINT:   Chief Complaint  Patient presents with  . Respiratory Distress   The patient is a 83 year old Caucasian male with past medical history significant for history of end-stage COPD, chronic oxygen at 4 L, spinal stenosis, chronic back pain, on opiates, depression, anxiety, who presents to the hospital with complaints of near syncopal episode 2 on the day of admission, weakness. He reported not eating or drinking well due to constipation. Labs revealed mild anemia. Patient was admitted. Orthostatic vital signs were positive with blood pressure dropping down to 98 systolic from 159 whenever patient stood up. No weight loss. The patient feels short of breath today, very tight and auscultation, no significant cough or sputum production. Orthostatic vital signs were rechecked and they were all normal. All radiologic studies were unremarkable.physical therapist recommended skilled nursing facility placement Review of Systems  Constitutional: Negative for chills, fever and weight loss.  HENT: Negative for congestion.   Eyes: Negative for blurred vision and double vision.  Respiratory: Negative for cough, sputum production, shortness of breath and wheezing.   Cardiovascular: Negative for chest pain, palpitations, orthopnea, leg swelling and PND.  Gastrointestinal: Positive for abdominal pain, constipation and nausea. Negative for blood in stool, diarrhea and vomiting.  Genitourinary: Negative for dysuria, frequency, hematuria and urgency.  Musculoskeletal: Negative for falls.  Neurological: Negative for dizziness, tremors, focal weakness and headaches.  Endo/Heme/Allergies: Does not bruise/bleed easily.  Psychiatric/Behavioral: Negative for depression. The patient does not have insomnia.     VITAL SIGNS: Blood pressure  126/70, pulse 87, temperature 98.2 F (36.8 C), temperature source Oral, resp. rate 20, height 5\' 6"  (1.676 m), weight 63.5 kg (140 lb), SpO2 98 %.  PHYSICAL EXAMINATION:   GENERAL:  83 y.o.-year-old patient lying in the bed with no acute distress.  EYES: Pupils equal, round, reactive to light and accommodation. No scleral icterus. Extraocular muscles intact.  HEENT: Head atraumatic, normocephalic. Oropharynx and nasopharynx clear.  NECK:  Supple, no jugular venous distention. No thyroid enlargement, no tenderness.  LUNGS: markedly Diminished breath sounds bilaterally, no significant wheezing, rales,rhonchi or crepitation, poor air movement. Using accessory muscles of respiration.  CARDIOVASCULAR: S1, S2 normal. No murmurs, rubs, or gallops.  ABDOMEN: Soft, tender diffusely, mostly in right lower quadrant and suprapubically, no rebound or guarding, abdomen is nondistended. Bowel sounds  diminished. No organomegaly or mass.  EXTREMITIES: No pedal edema, cyanosis, or clubbing.  NEUROLOGIC: Cranial nerves II through XII are intact. Muscle strength 5/5 in all extremities. Sensation intact. Gait not checked.  PSYCHIATRIC: The patient is alert and oriented x 3.  SKIN: No obvious rash, lesion, or ulcer.   ORDERS/RESULTS REVIEWED:   CBC  Recent Labs Lab 04/30/17 0935  WBC 10.6  HGB 11.8*  HCT 35.8*  PLT 248  MCV 87.4  MCH 28.8  MCHC 32.9  RDW 13.5   ------------------------------------------------------------------------------------------------------------------  Chemistries   Recent Labs Lab 04/30/17 0935  NA 141  K 4.1  CL 95*  CO2 42*  GLUCOSE 104*  BUN 14  CREATININE 0.63  CALCIUM 9.2   ------------------------------------------------------------------------------------------------------------------ estimated creatinine clearance is 63.9 mL/min (by C-G formula based on SCr of 0.63  mg/dL). ------------------------------------------------------------------------------------------------------------------ No results for input(s): TSH, T4TOTAL, T3FREE, THYROIDAB in the last 72 hours.  Invalid input(s): FREET3  Cardiac Enzymes  Recent Labs Lab 04/30/17 0946 04/30/17 1556  04/30/17 2140  TROPONINI <0.03 <0.03 <0.03   ------------------------------------------------------------------------------------------------------------------ Invalid input(s): POCBNP ---------------------------------------------------------------------------------------------------------------  RADIOLOGY: Dg Abd 1 View  Result Date: 05/01/2017 CLINICAL DATA:  Diffuse abdominal pain EXAM: ABDOMEN - 1 VIEW COMPARISON:  None. FINDINGS: Scattered large and small bowel gas is noted. Fecal material is noted throughout the colon consistent with a mild degree of constipation. No obstructive changes are seen. No free air is noted. Degenerative changes of the lumbar spine are seen. IMPRESSION: Mild constipation. Electronically Signed   By: Alcide CleverMark  Lukens M.D.   On: 05/01/2017 09:24   [place] Abdomen Complete  Result Date: 05/01/2017 CLINICAL DATA:  Right upper quadrant pain EXAM: ABDOMEN ULTRASOUND COMPLETE COMPARISON:  None. FINDINGS: Gallbladder: Multiple gallstones identified measuring up to about 15 mm. Sonographer reports no sonographic Murphy sign. No pericholecystic fluid. Gallbladder wall thickness within normal limits. Common bile duct: Diameter: 3 mm, within normal limits. Liver: No focal lesion identified. Within normal limits in parenchymal echogenicity. IVC: No abnormality visualized. Pancreas: Largely obscured by overlying bowel gas. Spleen: Size and appearance within normal limits. Right Kidney: Length: 9.6 cm. Echogenicity within normal limits. No mass or hydronephrosis visualized. Left Kidney: Length: 10.1 cm. Echogenicity within normal limits. No mass or hydronephrosis visualized. Abdominal aorta: No  aneurysm visualized. Other findings: None. IMPRESSION: Cholelithiasis without gallbladder wall thickening or pericholecystic fluid. No associated biliary dilatation. Electronically Signed   By: Kennith CenterEric  Mansell M.D.   On: 05/01/2017 10:52   [place] Carotid Bilateral  Result Date: 05/01/2017 CLINICAL DATA:  CVA EXAM: BILATERAL CAROTID DUPLEX ULTRASOUND TECHNIQUE: Wallace CullensGray scale imaging, color Doppler and duplex ultrasound were performed of bilateral carotid and vertebral arteries in the neck. COMPARISON:  None. FINDINGS: Criteria: Quantification of carotid stenosis is based on velocity parameters that correlate the residual internal carotid diameter with NASCET-based stenosis levels, using the diameter of the distal internal carotid lumen as the denominator for stenosis measurement. The following velocity measurements were obtained: RIGHT ICA:  136 cm/sec CCA:  137 cm/sec SYSTOLIC ICA/CCA RATIO:  1 DIASTOLIC ICA/CCA RATIO:  1.6 ECA:  131 cm/sec LEFT ICA:  243 cm/sec CCA:  97 cm/sec SYSTOLIC ICA/CCA RATIO:  2.5 DIASTOLIC ICA/CCA RATIO:  10.1 ECA:  130 cm/sec RIGHT CAROTID ARTERY: Moderate calcified plaque along the wall of the bulb extending into the internal carotid artery. Low resistance internal carotid Doppler pattern is preserved. RIGHT VERTEBRAL ARTERY:  Antegrade. LEFT CAROTID ARTERY: Moderate irregular calcified plaque in the left bulb. Low resistance internal carotid Doppler pattern is preserved. There is calcified plaque throughout the internal carotid artery. LEFT VERTEBRAL ARTERY:  Antegrade. IMPRESSION: 50-69% stenosis in the right internal carotid artery Greater than 70% stenosis in the left internal carotid artery. Electronically Signed   By: Jolaine ClickArthur  Hoss M.D.   On: 05/01/2017 16:20    EKG:  Orders placed or performed during the hospital encounter of 04/30/17  . ED EKG  . ED EKG  . EKG 12-Lead  . EKG 12-Lead    ASSESSMENT AND PLAN:  Active Problems:   COPD (chronic obstructive pulmonary disease)  (HCC)   Near syncope   Orthostatic hypotension   Dehydration   Constipation  #1. Near syncope, likely due to orthostatic hypotension/dehydration, patient was rehydrated, orthostatic vital signs normalized, oral intake was about 50%. today, following closely, carotid ultrasound, echocardiogram were unremarkable #2. Orthostatic hypotension, resolved, now off IV fluids, following blood pressure readings closely, likely related to recent poor oral intake #3. Constipation due to opiates, resolved, continue Colace, Dulcolax, MiraLAX, follow closely #4. Failure  to thrive, adult, likely due to constipation, follow oral intake after stool output, about 50% of offered to meals #5. COPDexacerbation, initiate patient on steroids in inhaled and systemic, advance DuoNeb's to every 4 hours, continue outpatient medications, initiate Zithromax, get sputum culture, ABGs were performed and they failed to reveal significant acidosis #6. Generalized weakness, physical therapist recommended skilled nursing facility placement, likely within next 1-2 days, social worker consultation is requested for placement   Management plans discussed with the patient, family and they are in agreement.   DRUG ALLERGIES: No Known Allergies  CODE STATUS:     Code Status Orders        Start     Ordered   04/30/17 1324  Limited resuscitation (code)  Continuous    Question Answer Comment  In the event of cardiac or respiratory ARREST: Initiate Code Blue, Call Rapid Response Yes   In the event of cardiac or respiratory ARREST: Perform CPR Yes   In the event of cardiac or respiratory ARREST: Perform Intubation/Mechanical Ventilation No   In the event of cardiac or respiratory ARREST: Use NIPPV/BiPAp only if indicated Yes   In the event of cardiac or respiratory ARREST: Administer ACLS medications if indicated Yes   In the event of cardiac or respiratory ARREST: Perform Defibrillation or Cardioversion if indicated Yes       04/30/17 1323    Code Status History    Date Active Date Inactive Code Status Order ID Comments User Context   This patient has a current code status but no historical code status.    Advance Directive Documentation     Most Recent Value  Type of Advance Directive  Living will, Healthcare Power of Attorney  Pre-existing out of facility DNR order (yellow form or pink MOST form)  -  "MOST" Form in Place?  -      TOTAL TIME TAKING CARE OF THIS PATIENT: 35 minutes.    Katharina Caper M.D on 05/02/2017 at 2:12 PM  Between 7am to 6pm - Pager - [phone]  After 6pm go to [email or web address] - password [email or web address]  [place]  [facility]  Office  [phone]  CC: Primary care physician; Mick Sell, MD

## 2017-05-03 LAB — CREATININE, SERUM
Creatinine, Ser: 0.51 mg/dL — ABNORMAL LOW (ref 0.61–1.24)
GFR calc non Af Amer: >60 mL/min (ref >60)

## 2017-05-03 MED ORDER — POLYETHYLENE GLYCOL 3350 17 G PO PACK
17.0000 g | PACK | Freq: Every day | ORAL | Status: DC
  Administered 2017-05-03 – 2017-05-04 (×2): 17 g via ORAL
  Filled 2017-05-03 (×2): qty 1

## 2017-05-03 MED ORDER — SENNA 8.6 MG PO TABS
1.0000 | ORAL_TABLET | Freq: Every day | ORAL | Status: DC
  Administered 2017-05-03 – 2017-05-04 (×2): 8.6 mg via ORAL
  Filled 2017-05-03 (×2): qty 1

## 2017-05-03 NOTE — Progress Notes (Signed)
Physical Therapy Treatment Patient Details Name: Gerald Ortiz MRN: [id number] DOB: 1932-06-04 Today's Date: 05/03/2017    History of Present Illness Gerald Ortiz  is a 83 y.o. male with a known history of end-stage emphysema on 4 L nasal cannula oxygen, spinal stenosis with chronic back pain on chronic narcotics, depression/anxiety comes to the emergency room accompanied by 2 daughters with above chief complaint.    PT Comments    Pt demonstrates significant improvement in stability since his initial PT evaluation. He is independent with bed mobility and transfers on this date. He is able to complete a full lap around RN station on this date with therapist. He denies any dizziness/lightheadedness during ambulation today. Reports mild to moderate DOE which is normal for patient as he has end stage emphysema. Remains on 4L/min supplemental O2 during ambulation which is his baseline. SaO2 remains >95% for the first 100' of ambulation and drops to 91% by the end of his walking distance. HR increases to around 104 bpm. Safe use of rolling walker and no instability noted today. No need for external assist to stabilize during gait. Pt is able to complete seated exercises with therapist on this date with good LE strength demonstrated. Patient's gait is significantly improved since last therapy session and pt reports it is close to his baseline. Discharge plan updated. Spoke with daughter via telephone and CSW notified. Pt will continue to benefit from PT services during admission to facilitate safe transition home.    Follow Up Recommendations  Home health PT;Supervision - Intermittent     Equipment Recommendations  Rolling walker with 5" wheels (Pt has rollator but needs front-wheeled walker)    Recommendations for Other Services       Precautions / Restrictions Precautions Precautions: Fall Restrictions Weight Bearing Restrictions: No    Mobility  Bed Mobility Overal bed mobility:  Independent             General bed mobility comments: Pt demonstrates improved speed and sequencing. Starts with HOB elevated but does not required bed rails on this date  Transfers Overall transfer level: Independent Equipment used: Rolling walker (2 wheeled)             General transfer comment: Improved speed and stability during transfers today. Does not require UE support in standing.  Ambulation/Gait Ambulation/Gait assistance: Supervision Ambulation Distance (Feet): 200 Feet Assistive device: Rolling walker (2 wheeled) Gait Pattern/deviations: Decreased step length - right;Decreased step length - left Gait velocity: Decreased but functional for full household distances   General Gait Details: Pt is able to complete a full lap around RN station on this date. He denies any dizziness/lightheadedness during ambulation today. Reports mild to moderate DOE which is normal for patient. Remains on 4L/min supplemental O2 whichi s baseline for him. SaO2 remains >95% for the first 100' of ambulation and drops to 91% by the end of his walking distance. Safe use of rolling walker and no instability noted today. No need for external assist to stabilize   Stairs            Wheelchair Mobility    Modified Rankin (Stroke Patients Only)       Balance Overall balance assessment: Needs assistance Sitting-balance support: No upper extremity supported Sitting balance-Leahy Scale: Fair     Standing balance support: No upper extremity supported Standing balance-Leahy Scale: Fair Standing balance comment: Able to maintain independent standing.  Cognition Arousal/Alertness: Awake/alert Behavior During Therapy: WFL for tasks assessed/performed Overall Cognitive Status: Within Functional Limits for tasks assessed                                        Exercises General Exercises - Lower Extremity Long Arc Quad:  Strengthening;Both;15 reps;Seated Heel Slides: Strengthening;Both;15 reps;Seated Hip ABduction/ADduction: Strengthening;Both;15 reps;Seated Hip Flexion/Marching: Strengthening;Both;15 reps;Seated Heel Raises: Strengthening;Both;15 reps;Seated    General Comments        Pertinent Vitals/Pain Pain Assessment: No/denies pain Pain Location: Pt denies pain currently. States he received pain medication recently Pain Intervention(s): Monitored during session    Home Living                      Prior Function            PT Goals (current goals can now be found in the care plan section) Acute Rehab PT Goals Patient Stated Goal: "I'm going home." PT Goal Formulation: With patient/family Time For Goal Achievement: 05/15/17 Potential to Achieve Goals: Good Progress towards PT goals: Progressing toward goals    Frequency    Min 2X/week      PT Plan Discharge plan needs to be updated    Co-evaluation              AM-PAC PT "6 Clicks" Daily Activity  Outcome Measure  Difficulty turning over in bed (including adjusting bedclothes, sheets and blankets)?: None Difficulty moving from lying on back to sitting on the side of the bed? : None Difficulty sitting down on and standing up from a chair with arms (e.g., wheelchair, bedside commode, etc,.)?: None Help needed moving to and from a bed to chair (including a wheelchair)?: None Help needed walking in hospital room?: None Help needed climbing 3-5 steps with a railing? : A Little 6 Click Score: 23    End of Session Equipment Utilized During Treatment: Gait belt;Oxygen;Other (comment) (4 L/min) Activity Tolerance: Patient tolerated treatment well Patient left: in bed;with call bell/phone within reach;with bed alarm set;Other (comment) (respiratory therapist in room) Nurse Communication: Other (comment) (CSW notified of updated dc recommendations) PT Visit Diagnosis: Unsteadiness on feet (R26.81);Repeated falls  (R29.6);Muscle weakness (generalized) (M62.81)     Time: 1610-9604 PT Time Calculation (min) (ACUTE ONLY): 23 min  Charges:  $Gait Training: 8-22 mins $Therapeutic Exercise: 8-22 mins                    G Codes:  Functional Assessment Tool Used: AM-PAC 6 Clicks Basic Mobility Functional Limitation: Mobility: Walking and moving around Mobility: Walking and Moving Around Current Status ([id number]): At least 1 percent but less than 20 percent impaired, limited or restricted Mobility: Walking and Moving Around Goal Status [phone]): At least 1 percent but less than 20 percent impaired, limited or restricted    Lynnea Maizes PT, DPT     Huprich,Jason 05/03/2017, 3:57 PM

## 2017-05-03 NOTE — Progress Notes (Signed)
[facility] -  at [facility]   PATIENT NAME: Gerald HammockRobert Ortiz    MR#:  [id number]  DATE OF BIRTH:  [birth year]  SUBJECTIVE:  CHIEF COMPLAINT:   Chief Complaint  Patient presents with  . Respiratory Distress   The patient is a 83 year old Caucasian male with past medical history significant for history of end-stage COPD, chronic oxygen at 4 L, spinal stenosis, chronic back pain, on opiates, depression, anxiety, who presents to the hospital with complaints of near syncopal episode 2 on the day of admission, weakness. He reported not eating or drinking well due to constipation. Labs revealed mild anemia. Patient was admitted. Orthostatic vital signs were positive with blood pressure dropping down to 98 systolic from 159 whenever patient stood up. No weight loss. The patient feels More comfortable today than regards to his lung condition, admits of some cough. No significant sputum production, able to take deep breath, still wheezes. All radiologic studies were unremarkable.physical therapist recommended skilled nursing facility placement, likely tomorrow Review of Systems  Constitutional: Negative for chills, fever and weight loss.  HENT: Negative for congestion.   Eyes: Negative for blurred vision and double vision.  Respiratory: Negative for cough, sputum production, shortness of breath and wheezing.   Cardiovascular: Negative for chest pain, palpitations, orthopnea, leg swelling and PND.  Gastrointestinal: Positive for abdominal pain, constipation and nausea. Negative for blood in stool, diarrhea and vomiting.  Genitourinary: Negative for dysuria, frequency, hematuria and urgency.  Musculoskeletal: Negative for falls.  Neurological: Negative for dizziness, tremors, focal weakness and headaches.  Endo/Heme/Allergies: Does not bruise/bleed easily.  Psychiatric/Behavioral: Negative for depression. The patient does not have insomnia.     VITAL SIGNS: Blood  pressure (!) 109/55, pulse 86, temperature 97.6 F (36.4 C), temperature source Oral, resp. rate 18, height 5\' 6"  (1.676 m), weight 63.5 kg (140 lb), SpO2 92 %.  PHYSICAL EXAMINATION:   GENERAL:  83 y.o.-year-old patient lying in the bed with no acute distress.  EYES: Pupils equal, round, reactive to light and accommodation. No scleral icterus. Extraocular muscles intact.  HEENT: Head atraumatic, normocephalic. Oropharynx and nasopharynx clear.  NECK:  Supple, no jugular venous distention. No thyroid enlargement, no tenderness.  LUNGS: Some better air entrance bilaterally, bilateral scattered expiratory wheezing, no significant rales,rhonchi or crepitation, . Intermittently using accessory muscles of respiration.  CARDIOVASCULAR: S1, S2 normal. No murmurs, rubs, or gallops.  ABDOMEN: Soft, tender diffusely, mostly in right lower quadrant and suprapubically, no rebound or guarding, abdomen is nondistended. Bowel sounds  diminished. No organomegaly or mass.  EXTREMITIES: No pedal edema, cyanosis, or clubbing.  NEUROLOGIC: Cranial nerves II through XII are intact. Muscle strength 5/5 in all extremities. Sensation intact. Gait not checked.  PSYCHIATRIC: The patient is alert and oriented x 3.  SKIN: No obvious rash, lesion, or ulcer.   ORDERS/RESULTS REVIEWED:   CBC  Recent Labs Lab 04/30/17 0935  WBC 10.6  HGB 11.8*  HCT 35.8*  PLT 248  MCV 87.4  MCH 28.8  MCHC 32.9  RDW 13.5   ------------------------------------------------------------------------------------------------------------------  Chemistries   Recent Labs Lab 04/30/17 0935 05/03/17 0422  NA 141  --   K 4.1  --   CL 95*  --   CO2 42*  --   GLUCOSE 104*  --   BUN 14  --   CREATININE 0.63 0.51*  CALCIUM 9.2  --    ------------------------------------------------------------------------------------------------------------------ estimated creatinine clearance is 63.9 mL/min (A) (by C-G formula based on SCr of  0.51  mg/dL (L)). ------------------------------------------------------------------------------------------------------------------ No results for input(s): TSH, T4TOTAL, T3FREE, THYROIDAB in the last 72 hours.  Invalid input(s): FREET3  Cardiac Enzymes  Recent Labs Lab 04/30/17 0946 04/30/17 1556 04/30/17 2140  TROPONINI <0.03 <0.03 <0.03   ------------------------------------------------------------------------------------------------------------------ Invalid input(s): POCBNP ---------------------------------------------------------------------------------------------------------------  RADIOLOGY: No results found.  EKG:  Orders placed or performed during the hospital encounter of 04/30/17  . ED EKG  . ED EKG  . EKG 12-Lead  . EKG 12-Lead    ASSESSMENT AND PLAN:  Active Problems:   COPD (chronic obstructive pulmonary disease) (HCC)   Near syncope   Orthostatic hypotension   Dehydration   Constipation  #1. Near syncope, likely due to orthostatic hypotension/dehydration, patient was rehydrated, orthostatic vital signs normalized, oral intake was about 25-75 %. today, following closely, carotid ultrasound, echocardiogram were unremarkable #2. Orthostatic hypotension, resolved, now off IV fluids, following blood pressure readings closely, likely related to recent poor oral intake #3. Constipation due to opiates, resolved, continue Colace, Dulcolax, MiraLAX, may need to have intermittent enemas #4. Failure to thrive, adult, likely due to constipation, significant shortness of breath, oral intake remains about 25-75% of offered meals, get palliative care involved #5. COPDexacerbation, continue patient on steroids inhaled and systemic,  continue DuoNeb's every 4 hours, continue outpatient medications, Zithromax, get sputum culture, ABGs were performed and they failed to reveal significant acidosis #6. Generalized weakness, physical therapist recommended skilled nursing facility  placement, likely tomorrow, discussed this patient's family and social worker    Management plans discussed with the patient, family and they are in agreement.   DRUG ALLERGIES: No Known Allergies  CODE STATUS:     Code Status Orders        Start     Ordered   04/30/17 1324  Limited resuscitation (code)  Continuous    Question Answer Comment  In the event of cardiac or respiratory ARREST: Initiate Code Blue, Call Rapid Response Yes   In the event of cardiac or respiratory ARREST: Perform CPR Yes   In the event of cardiac or respiratory ARREST: Perform Intubation/Mechanical Ventilation No   In the event of cardiac or respiratory ARREST: Use NIPPV/BiPAp only if indicated Yes   In the event of cardiac or respiratory ARREST: Administer ACLS medications if indicated Yes   In the event of cardiac or respiratory ARREST: Perform Defibrillation or Cardioversion if indicated Yes      04/30/17 1323    Code Status History    Date Active Date Inactive Code Status Order ID Comments User Context   This patient has a current code status but no historical code status.    Advance Directive Documentation     Most Recent Value  Type of Advance Directive  Living will, Healthcare Power of Attorney  Pre-existing out of facility DNR order (yellow form or pink MOST form)  -  "MOST" Form in Place?  -      TOTAL TIME TAKING CARE OF THIS PATIENT: 35 minutes.  Discussed with patient's daughter, all questions were answered  Mckala Pantaleon M.D on 05/03/2017 at 4:17 PM  Between 7am to 6pm - Pager - [phone]  After 6pm go to [email or web address] - password [email or web address] [facility]  [place] [place] [facility]  Office  [phone]  CC: Primary care physician; Mick Sell, MD

## 2017-05-03 NOTE — Progress Notes (Signed)
PT is recommending home health. Clinical Child psychotherapistocial Worker (CSW) explained to both patient's daughters [name] and [name] that his insurance will not cover SNF. CSW explained private pay SNF options. Daughters reported that they will accept home health PT and reported that patient would not want to pay privately for SNF. CSW sent RN case manager a message making her aware of above.   [name], LCSW [phone]

## 2017-05-03 NOTE — Progress Notes (Addendum)
Per RN patient is not stable for D/C today. Plan is for [facility] is aware of above. Patient's daughter [name] is aware of above.   [employer]orporatedBailey Raeford Brandenburg, LCSW [phone]

## 2017-05-04 DIAGNOSIS — R627 Adult failure to thrive: Secondary | ICD-10-CM

## 2017-05-04 DIAGNOSIS — R531 Weakness: Secondary | ICD-10-CM

## 2017-05-04 MED ORDER — PREDNISONE 10 MG (21) PO TBPK: ORAL_TABLET | ORAL | 0 refills | Status: AC

## 2017-05-04 MED ORDER — POLYETHYLENE GLYCOL 3350 17 G PO PACK: 17.0000 g | PACK | Freq: Two times a day (BID) | ORAL | 6 refills | Status: AC

## 2017-05-04 MED ORDER — ALBUTEROL SULFATE (2.5 MG/3ML) 0.083% IN NEBU: 3.0000 mL | INHALATION_SOLUTION | RESPIRATORY_TRACT | 12 refills | Status: AC | PRN

## 2017-05-04 MED ORDER — SENNA 8.6 MG PO TABS: 1.0000 | ORAL_TABLET | Freq: Every day | ORAL | 0 refills | Status: AC

## 2017-05-04 MED ORDER — MORPHINE SULFATE (CONCENTRATE) 10 MG/0.5ML PO SOLN
5.0000 mg | ORAL | Status: DC | PRN
  Administered 2017-05-04: 5 mg via ORAL
  Filled 2017-05-04: qty 1

## 2017-05-04 MED ORDER — AZITHROMYCIN 250 MG PO TABS: 250.0000 mg | ORAL_TABLET | Freq: Every day | ORAL | 0 refills | Status: AC

## 2017-05-04 NOTE — Progress Notes (Signed)
Discharge paperwork reviewed including new medications and medication changes with patient who verbalized understanding. Patient is stable and ready for discharge. Daughters to transport home with home oxygen tank. [facility].

## 2017-05-04 NOTE — Care Management (Addendum)
Physical therapy re-evaluation completed. Recommending home with home health and physical therapy. Discussed agencies with Mr Gerald Ortiz. Chose [facility]. Will get rolling walker from [facility]. Daughter present. Discharge to Home today per Dr. Winona LegatoVaickute Family will transport Gerald GreetBrenda S Toby Ayad RN MSN CCM Care Management [phone]

## 2017-05-04 NOTE — Discharge Summary (Signed)
[facility] - [facility] at [facility]   PATIENT NAME: Gerald Ortiz    MR#:  [id number]  DATE OF BIRTH:  29-Sep-1932  DATE OF ADMISSION:  04/30/2017 ADMITTING PHYSICIAN: Enedina Finner, MD  DATE OF DISCHARGE: 05/04/2017 12:27 PM  PRIMARY CARE PHYSICIAN: Mick Sell, MD     ADMISSION DIAGNOSIS:  Respiratory distress [R06.03] COPD exacerbation (HCC) [J44.1]  DISCHARGE DIAGNOSIS:  Active Problems:   COPD with acute exacerbation (HCC)   Near syncope   Orthostatic hypotension   Dehydration   Constipation   Failure to thrive in adult   Generalized weakness   SECONDARY DIAGNOSIS:   Past Medical History:  Diagnosis Date  . Anxiety   . COPD (chronic obstructive pulmonary disease) (HCC)   . Depression   . Dysphagia   . Esophageal spasm   . Spinal stenosis     .pro HOSPITAL COURSE:   The patient is a 83 year old Caucasian male with past medical history significant for history of end-stage COPD, chronic oxygen at 4 L, spinal stenosis, chronic back pain, on opiates, depression, anxiety, who presents to the hospital with complaints of near syncopal episode 2 on the day of admission, weakness. He reported not eating or drinking well due to constipation. Labs revealed mild anemia. Patient was admitted. Orthostatic vital signs were positive with blood pressure dropping down to 98 systolic from 159 whenever patient stood up. No weight loss.The patient was initiated on medications to produce bowel movements, also steroids, inhalation therapy, antibiotics for COPD exacerbation. This conservative therapy as condition improved and he was felt to be stable to be discharged home with home health services. Discussion by problem: #1. Near syncope, likely due to orthostatic hypotension/dehydration, patient was rehydrated, orthostatic vital signs normalized, oral intake has improved, however, still remained intermittently low at 25 %. , Needs to be followed closely  as outpatient, including patient's weight, carotid ultrasound, echocardiogram were unremarkable #2. Orthostatic hypotension, resolved, now off IV fluids, following blood pressure readings closely, likely related to recent poor oral intake #3. Constipation due to opiates, resolved, continue Colace, Dulcolax, MiraLAX, may need to have intermittent enemas, patient's family was educated about need of medications with opiates #4. Failure to thrive, adult, likely due to constipation, significant shortness of breath, oral intake intermittently is low at only about 25% of offered meals, would benefit from palliative care . Follow up as outpatient #5. COPDexacerbation, taper oral steroids ,  continue DuoNeb's every 4 hours, continue outpatient inhalation therapy, Zithromax, sputum culture was not obtained, ABGs were performed and the test revealed no acidosis #6. Generalized weakness, physical therapist recommended skilled nursing facility placement initially, however reassessed and patient was felt to be okay to be discharged home with home health, would benefit from palliative care evaluation at home  DISCHARGE CONDITIONS:   Stable  CONSULTS OBTAINED:    DRUG ALLERGIES:  No Known Allergies  DISCHARGE MEDICATIONS:   Discharge Medication List as of 05/04/2017 12:11 PM    START taking these medications   Details  azithromycin (ZITHROMAX) 250 MG tablet Take 1 tablet (250 mg total) by mouth daily., Starting Thu 05/04/2017, Normal    polyethylene glycol (MIRALAX / GLYCOLAX) packet Take 17 g by mouth 2 (two) times daily., Starting Thu 05/04/2017, Normal    predniSONE (STERAPRED UNI-PAK 21 TAB) 10 MG (21) TBPK tablet Please take 6 pills in the morning on the day 1, then taper by one pill daily until finished, continue usual doses of prednisone after continuation of  this prednisone order, thank you, Normal    senna (SENOKOT) 8.6 MG TABS tablet Take 1 tablet (8.6 mg total) by mouth daily., Starting Thu  05/04/2017, Normal      CONTINUE these medications which have CHANGED   Details  albuterol (PROVENTIL) (2.5 MG/3ML) 0.083% nebulizer solution Take 3 mLs by nebulization every 4 (four) hours as needed for wheezing or shortness of breath., Starting Thu 05/04/2017, Normal      CONTINUE these medications which have NOT CHANGED   Details  acetaminophen (TYLENOL) 500 MG tablet Take 500 mg by mouth every 6 (six) hours as needed for mild pain, moderate pain or fever. , Historical Med    albuterol (PROAIR HFA) 108 (90 Base) MCG/ACT inhaler Inhale 2 puffs into the lungs every 6 (six) hours as needed for wheezing. , Historical Med    ALPRAZolam (XANAX) 1 MG tablet Take 1 mg by mouth 2 (two) times daily as needed for sleep. , Historical Med    arformoterol (BROVANA) 15 MCG/2ML NEBU Inhale 2 mLs into the lungs every 12 (twelve) hours., Historical Med    budesonide (PULMICORT) 0.5 MG/2ML nebulizer solution Take 2 mLs by nebulization every 12 (twelve) hours., Historical Med    docusate sodium (COLACE) 100 MG capsule Take 100 mg by mouth 2 (two) times daily., Historical Med    HYDROcodone-acetaminophen (NORCO/VICODIN) 5-325 MG tablet Take 1 tablet by mouth 2 (two) times daily as needed., Starting Fri 04/21/2017, Historical Med    pantoprazole (PROTONIX) 40 MG tablet Take 40 mg by mouth daily., Starting Tue 03/28/2017, Historical Med    predniSONE (DELTASONE) 5 MG tablet Take 5 mg by mouth daily., Historical Med    sertraline (ZOLOFT) 25 MG tablet Take 25 mg by mouth daily., Starting Fri 01/27/2017, Historical Med    theophylline (THEO-24) 300 MG 24 hr capsule Take 300 mg by mouth daily., Historical Med    tiotropium (SPIRIVA HANDIHALER) 18 MCG inhalation capsule Place 1 capsule into inhaler and inhale daily., Historical Med    traMADol (ULTRAM) 50 MG tablet Take 50 mg by mouth every 6 (six) hours as needed for moderate pain. , Historical Med    aspirin EC 81 MG tablet Take 81 mg by mouth daily as  needed for mild pain or moderate pain. , Historical Med    Multiple Vitamin (MULTIVITAMIN WITH MINERALS) TABS tablet Take 1 tablet by mouth daily., Historical Med         DISCHARGE INSTRUCTIONS:    The patient is to follow-up with his primary care physician within one week after discharge, Dr. Meredeth Ide  If you experience worsening of your admission symptoms, develop shortness of breath, life threatening emergency, suicidal or homicidal thoughts you must seek medical attention immediately by calling 911 or calling your MD immediately  if symptoms less severe.  You Must read complete instructions/literature along with all the possible adverse reactions/side effects for all the Medicines you take and that have been prescribed to you. Take any new Medicines after you have completely understood and accept all the possible adverse reactions/side effects.   Please note  You were cared for by a hospitalist during your hospital stay. If you have any questions about your discharge medications or the care you received while you were in the hospital after you are discharged, you can call the unit and asked to speak with the hospitalist on call if the hospitalist that took care of you is not available. Once you are discharged, your primary care physician will handle any  further medical issues. Please note that NO REFILLS for any discharge medications will be authorized once you are discharged, as it is imperative that you return to your primary care physician (or establish a relationship with a primary care physician if you do not have one) for your aftercare needs so that they can reassess your need for medications and monitor your lab values.    Today   CHIEF COMPLAINT:   Chief Complaint  Patient presents with  . Respiratory Distress    HISTORY OF PRESENT ILLNESS:    VITAL SIGNS:  Blood pressure 138/69, pulse 88, temperature 97.5 F (36.4 C), temperature source Oral, resp. rate (!) 22,  height 5\' 6"  (1.676 m), weight 63.5 kg (140 lb), SpO2 97 %.  I/O:   Intake/Output Summary (Last 24 hours) at 05/04/17 1539 Last data filed at 05/03/17 1800  Gross per 24 hour  Intake              120 ml  Output                0 ml  Net              120 ml    PHYSICAL EXAMINATION:  GENERAL:  83 y.o.-year-old patient lying in the bed with no acute distress.  EYES: Pupils equal, round, reactive to light and accommodation. No scleral icterus. Extraocular muscles intact.  HEENT: Head atraumatic, normocephalic. Oropharynx and nasopharynx clear.  NECK:  Supple, no jugular venous distention. No thyroid enlargement, no tenderness.  LUNGS: Normal breath sounds bilaterally, no wheezing, rales,rhonchi or crepitation. No use of accessory muscles of respiration.  CARDIOVASCULAR: S1, S2 normal. No murmurs, rubs, or gallops.  ABDOMEN: Soft, non-tender, non-distended. Bowel sounds present. No organomegaly or mass.  EXTREMITIES: No pedal edema, cyanosis, or clubbing.  NEUROLOGIC: Cranial nerves II through XII are intact. Muscle strength 5/5 in all extremities. Sensation intact. Gait not checked.  PSYCHIATRIC: The patient is alert and oriented x 3.  SKIN: No obvious rash, lesion, or ulcer.   DATA REVIEW:   CBC  Recent Labs Lab 04/30/17 0935  WBC 10.6  HGB 11.8*  HCT 35.8*  PLT 248    Chemistries   Recent Labs Lab 04/30/17 0935 05/03/17 0422  NA 141  --   K 4.1  --   CL 95*  --   CO2 42*  --   GLUCOSE 104*  --   BUN 14  --   CREATININE 0.63 0.51*  CALCIUM 9.2  --     Cardiac Enzymes  Recent Labs Lab 04/30/17 2140  TROPONINI <0.03    Microbiology Results  Results for orders placed or performed during the hospital encounter of 04/30/17  Culture, expectorated sputum-assessment     Status: None   Collection Time: 05/02/17  7:53 AM  Result Value Ref Range Status   Specimen Description EXPECTORATED SPUTUM  Final   Special Requests NONE  Final   Sputum evaluation   Final     Sputum specimen not acceptable for testing.  Please recollect.     Report Status 05/02/2017 FINAL  Final  Culture, expectorated sputum-assessment     Status: None   Collection Time: 05/02/17  4:45 PM  Result Value Ref Range Status   Specimen Description SPUTUM  Final   Special Requests NONE  Final   Sputum evaluation   Final    Sputum specimen not acceptable for testing.  Please recollect.   SPO[facility] TO [facility] WITHERSPOON 05/02/17 @ 1816  MLK  Report Status 05/02/2017 FINAL  Final    RADIOLOGY:  No results found.  EKG:   Orders placed or performed during the hospital encounter of 04/30/17  . ED EKG  . ED EKG  . EKG 12-Lead  . EKG 12-Lead      Management plans discussed with the patient, family and they are in agreement.  CODE STATUS:  Code Status History    Date Active Date Inactive Code Status Order ID Comments User Context   04/30/2017  1:23 PM 05/04/2017  3:35 PM Partial Code [id number]  Enedina Finner, MD Inpatient    Questions for Most Recent Historical Code Status (Order [id number])    Question Answer Comment   In the event of cardiac or respiratory ARREST: Initiate Code Blue, Call Rapid Response Yes    In the event of cardiac or respiratory ARREST: Perform CPR Yes    In the event of cardiac or respiratory ARREST: Perform Intubation/Mechanical Ventilation No    In the event of cardiac or respiratory ARREST: Use NIPPV/BiPAp only if indicated Yes    In the event of cardiac or respiratory ARREST: Administer ACLS medications if indicated Yes    In the event of cardiac or respiratory ARREST: Perform Defibrillation or Cardioversion if indicated Yes         Advance Directive Documentation     Most Recent Value  Type of Advance Directive  Living will, Healthcare Power of Attorney  Pre-existing out of facility DNR order (yellow form or pink MOST form)  -  "MOST" Form in Place?  -      TOTAL TIME TAKING CARE OF THIS PATIENT: 40 minutes.    Katharina Caper M.D on 05/04/2017 at  3:39 PM  Between 7am to 6pm - Pager - [phone]  After 6pm go to [email or web address] - password [email or web address] [facility]  [place] [facility]  Office  [phone]  CC: Primary care physician; Mick Sell, MD

## 2018-02-16 ENCOUNTER — Emergency Department: Payer: Medicare Other

## 2018-02-16 ENCOUNTER — Emergency Department
Admission: EM | Admit: 2018-02-16 | Discharge: 2018-03-07 | Disposition: E | Payer: Medicare Other | Attending: Emergency Medicine | Admitting: Emergency Medicine

## 2018-02-16 DIAGNOSIS — Z87891 Personal history of nicotine dependence: Secondary | ICD-10-CM | POA: Diagnosis not present

## 2018-02-16 DIAGNOSIS — J449 Chronic obstructive pulmonary disease, unspecified: Secondary | ICD-10-CM | POA: Diagnosis not present

## 2018-02-16 DIAGNOSIS — F329 Major depressive disorder, single episode, unspecified: Secondary | ICD-10-CM | POA: Diagnosis not present

## 2018-02-16 DIAGNOSIS — Z4682 Encounter for fitting and adjustment of non-vascular catheter: Secondary | ICD-10-CM

## 2018-02-16 DIAGNOSIS — R4189 Other symptoms and signs involving cognitive functions and awareness: Secondary | ICD-10-CM

## 2018-02-16 DIAGNOSIS — R0603 Acute respiratory distress: Secondary | ICD-10-CM

## 2018-02-16 DIAGNOSIS — R55 Syncope and collapse: Secondary | ICD-10-CM | POA: Diagnosis present

## 2018-02-16 DIAGNOSIS — R0602 Shortness of breath: Secondary | ICD-10-CM

## 2018-02-16 DIAGNOSIS — F419 Anxiety disorder, unspecified: Secondary | ICD-10-CM | POA: Insufficient documentation

## 2018-02-16 DIAGNOSIS — Z79899 Other long term (current) drug therapy: Secondary | ICD-10-CM | POA: Diagnosis not present

## 2018-02-16 DIAGNOSIS — I469 Cardiac arrest, cause unspecified: Secondary | ICD-10-CM

## 2018-02-16 LAB — GLUCOSE, CAPILLARY: Glucose-Capillary: 128 mg/dL — ABNORMAL HIGH (ref 65–99)

## 2018-02-16 MED ORDER — IPRATROPIUM-ALBUTEROL 0.5-2.5 (3) MG/3ML IN SOLN
3.0000 mL | Freq: Once | RESPIRATORY_TRACT | Status: AC
  Administered 2018-02-16: 3 mL via RESPIRATORY_TRACT

## 2018-02-16 MED ORDER — EPINEPHRINE PF 1 MG/10ML IJ SOSY
PREFILLED_SYRINGE | INTRAMUSCULAR | Status: AC | PRN
  Administered 2018-02-16 (×3): 1 via INTRAVENOUS

## 2018-02-16 MED ORDER — IPRATROPIUM-ALBUTEROL 0.5-2.5 (3) MG/3ML IN SOLN
RESPIRATORY_TRACT | Status: AC
  Administered 2018-02-16: 3 mL via RESPIRATORY_TRACT
  Filled 2018-02-16: qty 9

## 2018-02-16 MED ORDER — ETOMIDATE 2 MG/ML IV SOLN
20.0000 mg | Freq: Once | INTRAVENOUS | Status: AC
  Administered 2018-02-16: 20 mg via INTRAVENOUS

## 2018-02-16 MED ORDER — ZIPRASIDONE MESYLATE 20 MG IM SOLR
INTRAMUSCULAR | Status: AC
  Filled 2018-02-16: qty 20

## 2018-02-16 MED ORDER — DEXTROSE 5 % IV SOLN
300.0000 mg | Freq: Once | INTRAVENOUS | Status: AC
  Administered 2018-02-16: 300 mg via INTRAVENOUS

## 2018-02-16 MED ORDER — SUCCINYLCHOLINE CHLORIDE 20 MG/ML IJ SOLN
100.0000 mg | Freq: Once | INTRAMUSCULAR | Status: AC
  Administered 2018-02-16: 100 mg via INTRAVENOUS

## 2018-02-18 MED FILL — Medication: Qty: 1 | Status: AC

## 2018-03-07 NOTE — ED Notes (Signed)
Pt transported to morgue with Asher MuirJamie, orderly.

## 2018-03-07 NOTE — ED Notes (Signed)
Synchronized cardiovert at 200J 

## 2018-03-07 NOTE — ED Provider Notes (Signed)
[facility] Emergency Department Provider Note  ____________________________________________  Time seen: Approximately 11:14 AM  I have reviewed the triage vital signs and the nursing notes.   HISTORY  Chief Complaint Altered Mental Status  Level 5 caveat:  Portions of the history and physical were unable to be obtained due to unresponsive   HPI Gerald Ortiz is a 84 y.o. male with a history of COPD who presents for being unresponsive.  The patient was found at home unresponsive by his wife which called 911.  When EMS arrived patient was unresponsive but breathing and had a pulse.  In route patient stopped breathing temporarily and arrived in the emergency room being bagged.  Patient's blood glucose per EMS was normal.  He was hypotensive and started on fluids. GCS 8 on arrival, hypoxic on BVM with bilateral crackles and decreased air movement.   Past Medical History:  Diagnosis Date  . Anxiety   . COPD (chronic obstructive pulmonary disease) (HCC)   . Depression   . Dysphagia   . Esophageal spasm   . Spinal stenosis     Patient Active Problem List   Diagnosis Date Noted  . Failure to thrive in adult 05/04/2017  . Generalized weakness 05/04/2017  . Near syncope 05/01/2017  . Orthostatic hypotension 05/01/2017  . Dehydration 05/01/2017  . Constipation 05/01/2017  . COPD with acute exacerbation (HCC) 04/30/2017    No past surgical history on file.  Prior to Admission medications   Medication Sig Start Date End Date Taking? Authorizing Provider  acetaminophen (TYLENOL) 500 MG tablet Take 500 mg by mouth every 6 (six) hours as needed for mild pain, moderate pain or fever.     [provider]  albuterol (PROAIR HFA) 108 (90 Base) MCG/ACT inhaler Inhale 2 puffs into the lungs every 6 (six) hours as needed for wheezing.     [provider]  albuterol (PROVENTIL) (2.5 MG/3ML) 0.083% nebulizer solution Take 3 mLs by nebulization  every 4 (four) hours as needed for wheezing or shortness of breath. 05/04/17   Katharina CaperVaickute, Rima, MD  ALPRAZolam Prudy Feeler(XANAX) 1 MG tablet Take 1 mg by mouth 2 (two) times daily as needed for sleep.     [provider]  arformoterol (BROVANA) 15 MCG/2ML NEBU Inhale 2 mLs into the lungs every 12 (twelve) hours.    [provider]  aspirin EC 81 MG tablet Take 81 mg by mouth daily as needed for mild pain or moderate pain.     [provider]  azithromycin (ZITHROMAX) 250 MG tablet Take 1 tablet (250 mg total) by mouth daily. 05/04/17   Katharina CaperVaickute, Rima, MD  budesonide (PULMICORT) 0.5 MG/2ML nebulizer solution Take 2 mLs by nebulization every 12 (twelve) hours.    [provider]  docusate sodium (COLACE) 100 MG capsule Take 100 mg by mouth 2 (two) times daily.    [provider]  HYDROcodone-acetaminophen (NORCO/VICODIN) 5-325 MG tablet Take 1 tablet by mouth 2 (two) times daily as needed. 04/21/17   [provider]  Multiple Vitamin (MULTIVITAMIN WITH MINERALS) TABS tablet Take 1 tablet by mouth daily.    [provider]  pantoprazole (PROTONIX) 40 MG tablet Take 40 mg by mouth daily. 03/28/17   [provider]  polyethylene glycol (MIRALAX / GLYCOLAX) packet Take 17 g by mouth 2 (two) times daily. 05/04/17   Katharina CaperVaickute, Rima, MD  predniSONE (DELTASONE) 5 MG tablet Take 5 mg by mouth daily.    [provider]  predniSONE (STERAPRED UNI-PAK 21 TAB) 10 MG (21) TBPK tablet Please take 6 pills in the morning on the day 1, then taper by one pill daily until finished, continue usual doses of prednisone after continuation of this prednisone order, thank you 05/04/17   Katharina Caper, MD  senna (SENOKOT) 8.6 MG TABS tablet Take 1 tablet (8.6 mg total) by mouth daily. 05/04/17   Katharina Caper, MD  sertraline (ZOLOFT) 25 MG tablet Take 25 mg by mouth daily. 01/27/17   [provider]  theophylline (THEO-24) 300 MG 24 hr capsule Take 300 mg by  mouth daily.    [provider]  tiotropium (SPIRIVA HANDIHALER) 18 MCG inhalation capsule Place 1 capsule into inhaler and inhale daily.    [provider]  traMADol (ULTRAM) 50 MG tablet Take 50 mg by mouth every 6 (six) hours as needed for moderate pain.     [provider]    Allergies Patient has no known allergies.  No family history on file.  Social History Social History   Tobacco Use  . Smoking status: Former Games developer  . Smokeless tobacco: Current User    Types: Chew  Substance Use Topics  . Alcohol use: Yes    Alcohol/week: 1.8 oz    Types: 3 Cans of beer per week    Comment: 3 cans of beer/week  . Drug use: No    Review of Systems + unresponsive ____________________________________________   PHYSICAL EXAM:  VITAL SIGNS: ED Triage Vitals  Enc Vitals Group     BP Mar 13, 2018 0948 (!) 123/46     Pulse Rate Mar 13, 2018 0945 (!) 102     Resp 03-13-2018 0948 12     Temp --      Temp src --      SpO2 03/13/18 0945 99 %     Weight Mar 13, 2018 0949 146 lb 11.2 oz (66.5 kg)     Height Mar 13, 2018 0949 5\' 9"  (1.753 m)     Head Circumference --      Peak Flow --      Pain Score --      Pain Loc --      Pain Edu? --      Excl. in GC? --     Constitutional: Opens eyes to painful stimuli, hypoxic, severe respiratory distress HEENT:      Head: Normocephalic and atraumatic.         Eyes: Conjunctivae are normal. Sclera is non-icteric. Pupils 3mm and reactive      Mouth/Throat: Mucous membranes are dry      Neck: Supple with no signs of meningismus. Cardiovascular: Tachycardic with regular rhythm. No murmurs, gallops, or rubs. 2+ symmetrical distal pulses are present in all extremities. No JVD Respiratory: Increased work of breathing, hypoxic, being bagged with decreased air movement and crackles bilaterally.  Gastrointestinal: Soft, and non distended with positive bowel sounds. No rebound or guarding. Musculoskeletal: No edema, cyanosis, or erythema of  extremities. Neurologic: GCS 8 (E2V1M5) Skin: Skin is warm, dry and intact. No rash noted.  ____________________________________________   LABS (all labs ordered are listed, but only abnormal results are displayed)  Labs Reviewed  GLUCOSE, CAPILLARY - Abnormal; Notable for the following components:      Result Value   Glucose-Capillary 128 (*)    All other components within normal limits   ____________________________________________  EKG  ED ECG REPORT I, Nita Sickle, the attending physician, personally viewed and interpreted this ECG.  Junctional rhythm, rate of 65, normal QRS  and QTC, normal axis, ST depressions on inferior and lateral leads with no ST elevation.  Depressions new when compared to prior from 6/18  09:57 -sinus tachycardia, rate of 155, normal intervals, normal axis, persistent ST depressions with no ST elevation. ____________________________________________  RADIOLOGY  none  ____________________________________________   PROCEDURES  Procedure(s) performed: yes Procedure Name: Intubation Date/Time: 2018-02-17 11:35 AM Performed by: Nita Sickle, MD Pre-anesthesia Checklist: Patient identified, Patient being monitored, Emergency Drugs available, Timeout performed and Suction available Oxygen Delivery Method: Non-rebreather mask Preoxygenation: Pre-oxygenation with 100% oxygen Induction Type: Rapid sequence Ventilation: Mask ventilation without difficulty Laryngoscope Size: Glidescope and 3 Tube size: 7.5 mm Number of attempts: 1 Placement Confirmation: ETT inserted through vocal cords under direct vision,  CO2 detector and Breath sounds checked- equal and bilateral Secured at: 23 cm Tube secured with: ETT holder Dental Injury: Teeth and Oropharynx as per pre-operative assessment       Critical Care performed:  Yes  CRITICAL CARE Performed by: Nita Sickle  ?  Total critical care time: 40 min  Critical care time was  exclusive of separately billable procedures and treating other patients.  Critical care was necessary to treat or prevent imminent or life-threatening deterioration.  Critical care was time spent personally by me on the following activities: development of treatment plan with patient and/or surrogate as well as nursing, discussions with consultants, evaluation of patient's response to treatment, examination of patient, obtaining history from patient or surrogate, ordering and performing treatments and interventions, ordering and review of laboratory studies, ordering and review of radiographic studies, pulse oximetry and re-evaluation of patient's condition.  ____________________________________________   INITIAL IMPRESSION / ASSESSMENT AND PLAN / ED COURSE   84 y.o. male with a history of COPD who presents for being unresponsive.  Patient arrives with a GCS of 8 being bagged and hypoxic.  Due to inability to protect airway and hypoxia the decision was made to intubate patient.  Patient had diffuse crackles and decreased air movement bilaterally.  Patient was intubated with etomidate and succinylcholine per procedure note above. Patient placed on vent with increased expiratory time and started on duoneb x 3.  Few minutes after intubation patient lost pulses and CPR was started with [facility] device following ACLS protocol.  After 1 round of epinephrine ROSC was achieved.  Patient remained extremely hypotensive with afib on telemetry. Patient was cardioverted x 2 and lost pulses again. CPR resumed using [employer] with initial rhythm of Vtach. Patient received several rounds of epinephrine and amiodarone per ACLS protocol. Patient was disconnected from the vent due to difficulty maintaining normal sats and concerns for air stacking. Patient continued to have bilateral breath sounds, glidescope was used to ensure position of ETT which was found to be correctly placed through the vocal cords. Patient had  large amount of frothy brownish secretions from ETT which was suction several times. Patient was then noted to have progressing crepitus on the L side and needle decompression was performed for concerns of tension PTX. In spite of several rounds of CPR, epi, and shocking patient remained pulseless and went into PEA arrest. Time of death was called at 10:11AM. Both daughters were then brought into the room with chaplain. Daughters then told me that patient was DNR/DNI however they did not have time to tell EMS that. They requested that ETT and all other devices be removed from the patient's body prior to their mother's visit with patient.       As part of my medical  decision making, I reviewed the following data within the electronic MEDICAL RECORD NUMBER Nursing notes reviewed and incorporated, EKG interpreted , Old chart reviewed, Notes from prior ED visits and [place] Controlled Substance Database    Pertinent labs & imaging results that were available during my care of the patient were reviewed by me and considered in my medical decision making (see chart for details).    ____________________________________________   FINAL CLINICAL IMPRESSION(S) / ED DIAGNOSES  Final diagnoses:  Respiratory distress, acute  Cardiac arrest (HCC)  Unresponsive      NEW MEDICATIONS STARTED DURING THIS VISIT:  ED Discharge Orders    None       Note:  This document was prepared using Dragon voice recognition software and may include unintentional dictation errors.    Don Perking, [place], MD March 01, 2018 [phone]

## 2018-03-07 NOTE — ED Triage Notes (Signed)
Pt comes into the ED via EMS from home with reports that the wife states pt was up walking and talking normal this morning and then later unresponsive. EMS report pt stopped breathing in route and attempted to place a king airway without succuss, pt arrives with bag mask. Is responding to verbal commands but is not able to maintain his airway. EDP at the bedside preparing to intubate.. CBGH 175, 400ml given of NS in route..Marland Kitchen

## 2018-03-07 NOTE — ED Notes (Signed)
ED Provider at bedside to talk to family.

## 2018-03-07 NOTE — ED Notes (Signed)
OG placed 6118fr, 60 @the  lip

## 2018-03-07 NOTE — Progress Notes (Signed)
Chaplain was paged for a death. Family was on the way to the room when Chaplain arrived. Chaplain sought to have story telling but family found it difficult. Chaplain prayed with the family. The wanted to be alone with the Pt and chaplain let them know he would return if needed. Chaplain got contact information.   February 13, 2018 1000  Clinical Encounter Type  Visited With Patient and family together  Visit Type Death  Referral From Nurse  Spiritual Encounters  Spiritual Needs Grief support

## 2018-03-07 NOTE — ED Notes (Signed)
EDP announced time of death

## 2018-03-07 NOTE — ED Notes (Signed)
Synchronized cardiovert at 200J

## 2018-03-07 NOTE — ED Notes (Signed)
Pt went into pulseless VT and was defib at 200J

## 2018-03-07 DEATH — deceased
